# Patient Record
Sex: Male | Born: 1965
Health system: Western US, Academic
[De-identification: ages and names within clinical notes are randomized; demographics above are authoritative.]

---

## 2016-10-11 ENCOUNTER — Ambulatory Visit: Payer: BLUE CROSS/BLUE SHIELD

## 2019-02-25 ENCOUNTER — Telehealth: Payer: BLUE CROSS/BLUE SHIELD

## 2019-02-25 NOTE — Telephone Encounter
Call Back Request    MD:  [facility] Tra[facility]/Ortho Sports Surg    Reason for call back: Pt called in and would like to be seen by ortho for lt lower leg dog bite.Pt advised he got bit on 10/07 and went to closest ED that was [facility] had stiches antibiotics and a tetanus shot.Pt then f/u with PCP 2 days later and then had the wound infected per his pcp and was given more antibiotics.Pt went back to the ED on 10/11 and was admitted do to the infection and was then discharged on 10/14.Pt at this time has been following up with Infectious Disease doctor.Pt would like to be seen by ortho since swelling of leg is still present and hasn't subsided.Please advise and call back pt who would be appropriate Ortho to see pt for this matter.Thank you.    Any Symptoms:  []  Yes  [x]  No      ? If yes, what symptoms are you experiencing:    o Duration of symptoms (how long):    o Have you taken medication for symptoms (OTC or Rx):      Patient or caller has been notified of the 24-48 hour turnaround time.

## 2019-02-25 NOTE — Telephone Encounter
Reply by: Karie Mainland  Probably one of the non op or general ortho should see this patient.

## 2019-02-25 NOTE — Telephone Encounter
Called patient and left voicemail asking patient to return call to office.   Called patient to inquire and confirm if patient has a fracture in his lower extremity, if not, patient should not be scheduled with Dr. Alyse Low.

## 2019-02-26 NOTE — Telephone Encounter
After speaking with Dr. Dewayne Shorter, he has okay patient be seen for swelling of leg for evaluation.

## 2019-03-02 NOTE — Progress Notes
Date: 03/03/2019   Name: Evan Fitzgerald   MRN: [id number]   DOB: [birth year]   Age: 53 y.o.         Evan Fitzgerald, M.D.        Patient was seen in our [employer]    A COVID-19 questionnaire was filled out by the patient and was answered as NEGATIVE for all statements, including, COVID-19 positive diagnosis in the last 14 days, contact with anybody diagnosed with  COVID-19 in the last 14 days, fever, headaches, muscle pain, weakness, and diarrhea nausea vomiting abdominal pain, respiratory illness/cough, shortness of breath, loss of smell, loss of taste, rash skin irritation, unexplained hemorrhage, and fatigue. Temperature was taken and it was less than 100? F.     Referring Provider:   No ref. provider found    Chief Complaint:   Left Ankle Pain    History: Evan Fitzgerald is a 53 y.o. male who presents today for orthopedic evaluation of his Left ankle. Pain began on ***.    Evan Fitzgerald indicates that his pain is {Blank single:19197::''sharp'',''aching'',''throbbing'',''dull''}, and rates the severity as ***/10. The problem is localized over the *** aspect. The patient {Blank single:20442::''admits'',''denies''} ankle instability. The pain is aggravated by activity and {Blank single:19197::''does not improve'',''improves''} with rest.  The patient has tried OTC pain medications without relief.     PRIOR TREATMENT HAS INCLUDED:  []  Physical Therapy   []  Cortisone Injection  []  Surgery on this body part  []  NSAID  []  Icing        Hobbies/Sports: ***    Occupation: ***    PAST MEDICAL HISTORY:   Past Medical History:   Diagnosis Date   ? Herpes    ? MRSA cellulitis     Lt foot         PAST SURGICAL HISTORY:   No past surgical history on file.    MEDICATIONS:  Outpatient Medications Prior to Visit   Medication Sig   ? clarithromycin 500 mg tablet take 1 tablet by mouth twice a day for 14 days   ? dicyclomine 20 mg tablet TAKE 1 TABLET BEFORE MEALS AND AT BEDTIME AS NEEDED ? doxycycline 100 mg capsule Take 1 capsule (100 mg total) by mouth two (2) times daily.   ? finasteride (PROPECIA) 1 mg tablet Take 1 mg by mouth daily.   ? Sildenafil Citrate (VIAGRA PO) Take by mouth.   ? sulfacetamide-sulfur 10-5% emulsion Apply topically two (2) times daily WASH AFFECTED AREA(S) TWICE DAILY AS DIRECTED.   ? tacrolimus 0.1% ointment Apply topically two (2) times daily.   ? triamcinolone 0.1% ointment Apply topically two (2) times daily Apply to affected areas 2 times daily x 10 days.   ? valacyclovir 500 mg tablet    ? ValACYclovir HCl (VALTREX PO) Take by mouth.   ? VIAGRA 100 MG tablet take 1 tablet by mouth once daily     No facility-administered medications prior to visit.        ALLERGIES:  No Known Allergies      PATIENT SOCIAL HISTORY:   Social History     Socioeconomic History   ? Marital status: Married     Spouse name: Not on file   ? Number of children: Not on file   ? Years of education: Not on file   ? Highest education level: Not on file   Occupational History   ? Not on file   Social Needs   ?  Financial resource strain: Not on file   ? Food insecurity:     Worry: Not on file     Inability: Not on file   ? Transportation needs:     Medical: Not on file     Non-medical: Not on file   Tobacco Use   ? Smoking status: Never Smoker   Substance and Sexual Activity   ? Alcohol use: Not on file   ? Drug use: Not on file   ? Sexual activity: Not on file   Lifestyle   ? Physical activity:     Days per week: Not on file     Minutes per session: Not on file   ? Stress: Not on file   Relationships   ? Social connections:     Talks on phone: Not on file     Gets together: Not on file     Attends religious service: Not on file     Active member of club or organization: Not on file     Attends meetings of clubs or organizations: Not on file     Relationship status: Not on file   Other Topics Concern   ? Not on file   Social History Narrative   ? Not on file Family Medical History: Non contributory by the patient.    Review of Systems: The review of systems as documented today in the medical record is remarkable for the positive orthopedic problems discussed above and is otherwise non-contributory with respect to Constitutional, ENT, Cardiovascular, GU, Skin, Neurologic, Endocrine,   Hematologic, Psychiatric, Gastrointestinal, Respiratory, Eyes and Allergic/Immunologic systems.      PHYSICAL EXAM:   There were no vitals filed for this visit. There were no vitals filed for this visit.    Constitutional: Evan Fitzgerald is a wdwn 53 y.o. male in no acute distress.   Psyc: He is alert and oriented x3 with a normal mood and affect.  ENT: Hearing is intact and is unassisted by a hearing aid.   Eye: EOM intact and vision is unassisted by corrective lenses.    Neuro: Skin sensation is intact distally to the medial, lateral, dorsal, plantar, and first dorsal web-space distribution  Vascular: Pulses are intact distally.  Skin: Intact, no edema.  Musculoskeletal:   Left lower extremity  Patient ambulates {Select:27552}  GAIT: Ambulates without limp  STANDING ALIGNMENT: Neutral  INSPECTION: His ankle reveals:  no effusion,  no edema,   no ecchymosis     PALPATION:   no tenderness over ATFL,   no tenderness over CFL,   no tenderness over PTFL,  no tenderness deltoid ligament,   no tenderness over peroneal tendons,   no tenderness over posterior tibialis,  no tenderness over the medial malleolus,   no tenderness over the lateral malleolus,   no tenderness over the syndesmosis,   no tenderness over the sinus tarsi,   no tenderness over Achilles insertion,   no tenderness over medial calcaneal tuberosity,   no tenderness over plantar fascia.    RANGE OF MOTION:   ***? of dorsiflexion,   ***? of plantarflexion,   ***? of inversion and ***?eversion    SPECIAL TESTS:   - syndesmotic squeeze test   - Homan?s Sign  - Thompson?s test  - Tinel Test The ankle is stable to anterior/internal rotation drawer  The ankle is stable to anterior/external rotation drawer    STRENGTH:  5/5 in Dorsiflexion  5/5 in Plantarflexion  5/5 in Eversion  5/5 in  Inversion    X-Rays: 3 views of the Left weightbearing ankle were ordered, taken and evaluated today. These reveal mortise to be intact and reduced with no evidence of fracture, dislocation or osteochondral lesion    Impression:   1. Left    Fitzgerald: Reviewed my findings with the patient. I recommended that the patient:     1.          All questions were answered with stated understanding.      NEXT APPOINTMENT:  The patient will follow up in    Portions of this chart for patient Elmond Poehlman on 03/03/2019 10:31 AM, were written by Dorian Pod, acting as the medical scribe, with oversight and review by Evan Plan, MD.        ___________________  Albesa Seen, MD  ORTHOPEDIC SURGERY  03/03/2019

## 2019-03-03 ENCOUNTER — Ambulatory Visit: Payer: PRIVATE HEALTH INSURANCE

## 2019-03-04 ENCOUNTER — Telehealth: Payer: BLUE CROSS/BLUE SHIELD

## 2019-03-04 NOTE — Telephone Encounter
-----   Message from Janeece Riggers sent at 03/02/2019 12:05 PM PDT -----  Hi [name],Patient left vm for you, returning your call. No specific details provided.     Can you please follow up with patient?    -Donna Christen

## 2019-03-04 NOTE — Telephone Encounter
Left a message for patient Evan Fitzgerald requesting a call out.  Per my understanding he called me back during my absence.  I'm returning his call at this time for the scheduling of his Consult.

## 2019-03-13 ENCOUNTER — Ambulatory Visit: Payer: BLUE CROSS/BLUE SHIELD

## 2019-03-13 NOTE — Progress Notes
Date: 03/13/2019   Name: Evan Fitzgerald   MRN: [id number]   DOB: 09-Jul-1965   Age: 53 y.o.         Maia Plan, M.D.        Patient was seen in our [employer]    A COVID-19 questionnaire was filled out by the patient and was answered as NEGATIVE for all statements, including, COVID-19 positive diagnosis in the last 14 days, contact with anybody diagnosed with  COVID-19 in the last 14 days, fever, headaches, muscle pain, weakness, and diarrhea nausea vomiting abdominal pain, respiratory illness/cough, shortness of breath, loss of smell, loss of taste, rash skin irritation, unexplained hemorrhage, and fatigue. Temperature was taken and it was less than 100? F.     Chief Complaint:   Left Ankle Pain    History: Evan Fitzgerald is a 53 y.o. male who presents today for orthopedic evaluation of his Left ankle. Pain began on ***.    Evan Fitzgerald indicates that his pain is {Blank single:19197::''sharp'',''aching'',''throbbing'',''dull''}, and rates the severity as ***/10. The problem is localized over the *** aspect. The patient {Blank single:20442::''admits'',''denies''} ankle instability. The pain is aggravated by activity and {Blank single:19197::''does not improve'',''improves''} with rest.  The patient has tried OTC pain medications without relief.     PRIOR TREATMENT HAS INCLUDED:  []  Physical Therapy   []  Cortisone Injection  []  Surgery on this body part  []  NSAID  []  Icing        Hobbies/Sports: ***    Occupation: ***    PAST MEDICAL HISTORY:   Past Medical History:   Diagnosis Date   ? Herpes    ? MRSA cellulitis     Lt foot         PAST SURGICAL HISTORY:   No past surgical history on file.    MEDICATIONS:  Outpatient Medications Prior to Visit   Medication Sig   ? clarithromycin 500 mg tablet take 1 tablet by mouth twice a day for 14 days   ? dicyclomine 20 mg tablet TAKE 1 TABLET BEFORE MEALS AND AT BEDTIME AS NEEDED   ? doxycycline 100 mg capsule Take 1 capsule (100 mg total) by mouth two (2) times daily. ? finasteride (PROPECIA) 1 mg tablet Take 1 mg by mouth daily.   ? Sildenafil Citrate (VIAGRA PO) Take by mouth.   ? sulfacetamide-sulfur 10-5% emulsion Apply topically two (2) times daily WASH AFFECTED AREA(S) TWICE DAILY AS DIRECTED.   ? tacrolimus 0.1% ointment Apply topically two (2) times daily.   ? triamcinolone 0.1% ointment Apply topically two (2) times daily Apply to affected areas 2 times daily x 10 days.   ? valacyclovir 500 mg tablet    ? ValACYclovir HCl (VALTREX PO) Take by mouth.   ? VIAGRA 100 MG tablet take 1 tablet by mouth once daily     No facility-administered medications prior to visit.        ALLERGIES:  No Known Allergies      PATIENT SOCIAL HISTORY:   Social History     Socioeconomic History   ? Marital status: Married     Spouse name: Not on file   ? Number of children: Not on file   ? Years of education: Not on file   ? Highest education level: Not on file   Occupational History   ? Not on file   Social Needs   ? Financial resource strain: Not on file   ? Food insecurity:  Worry: Not on file     Inability: Not on file   ? Transportation needs:     Medical: Not on file     Non-medical: Not on file   Tobacco Use   ? Smoking status: Never Smoker   Substance and Sexual Activity   ? Alcohol use: Not on file   ? Drug use: Not on file   ? Sexual activity: Not on file   Lifestyle   ? Physical activity:     Days per week: Not on file     Minutes per session: Not on file   ? Stress: Not on file   Relationships   ? Social connections:     Talks on phone: Not on file     Gets together: Not on file     Attends religious service: Not on file     Active member of club or organization: Not on file     Attends meetings of clubs or organizations: Not on file     Relationship status: Not on file   Other Topics Concern   ? Not on file   Social History Narrative   ? Not on file         Family Medical History: Non contributory by the patient. Review of Systems: The review of systems as documented today in the medical record is remarkable for the positive orthopedic problems discussed above and is otherwise non-contributory with respect to Constitutional, ENT, Cardiovascular, GU, Skin, Neurologic, Endocrine, Hematologic, Psychiatric, Gastrointestinal, Respiratory, Eyes and Allergic/Immunologic systems.      PHYSICAL EXAM:   There were no vitals filed for this visit. There were no vitals filed for this visit.    Constitutional: Evan Fitzgerald is a wdwn 53 y.o. male in no acute distress.   Psyc: He is alert and oriented x3 with a normal mood and affect.  ENT: Hearing is intact and is unassisted by a hearing aid.   Eye: EOM intact and vision is unassisted by corrective lenses.    Neuro: Skin sensation is intact distally to the medial, lateral, dorsal, plantar, and first dorsal web-space distribution  Vascular: Pulses are intact distally.  Skin: Intact, no edema.  Musculoskeletal:   Left lower extremity  Patient ambulates {Select:27552}  GAIT: Ambulates without limp  STANDING ALIGNMENT: Neutral  INSPECTION: His ankle reveals:  no effusion,  no edema,   no ecchymosis     PALPATION:   no tenderness over ATFL,   no tenderness over CFL,   no tenderness over PTFL,  no tenderness deltoid ligament,   no tenderness over peroneal tendons,   no tenderness over posterior tibialis,  no tenderness over the medial malleolus,   no tenderness over the lateral malleolus,   no tenderness over the syndesmosis,   no tenderness over the sinus tarsi,   no tenderness over Achilles insertion,   no tenderness over medial calcaneal tuberosity,   no tenderness over plantar fascia.    RANGE OF MOTION:   ***? of dorsiflexion,   ***? of plantarflexion,   ***? of inversion and ***?eversion    SPECIAL TESTS:   - syndesmotic squeeze test   - Homan?s Sign  - Thompson?s test  - Tinel Test  The ankle is stable to anterior/internal rotation drawer The ankle is stable to anterior/external rotation drawer    STRENGTH:  5/5 in Dorsiflexion  5/5 in Plantarflexion  5/5 in Eversion  5/5 in Inversion    X-Rays: 3 views of the Left weightbearing ankle  were ordered, taken and evaluated today. These reveal mortise to be intact and reduced with no evidence of fracture, dislocation or osteochondral lesion    Impression:   1. Left    Plan: Reviewed my findings with the patient. I recommended that the patient:     1.          All questions were answered with stated understanding.      NEXT APPOINTMENT:  The patient will follow up in    Portions of this chart for patient Evan Fitzgerald on 03/13/2019 4:43 PM, were written by Dorian Pod, acting as the medical scribe, with oversight and review by Maia Plan, MD.        ___________________  Albesa Seen, MD  ORTHOPEDIC SURGERY  03/13/2019

## 2019-04-06 ENCOUNTER — Ambulatory Visit: Payer: PRIVATE HEALTH INSURANCE

## 2021-06-16 IMAGING — MR MRI RIGHT ANKLE WITHOUT CONTRAST
5 of 6 series · 24 of 40 positions shown · IV contrast (gadolinium)
Comparison: None.

________________________________________________________________________________________________ 
MRI RIGHT ANKLE WITHOUT CONTRAST, 06/16/2021 [DATE]: 
CLINICAL INDICATION: Sharp shooting "on fire" dull pain. Achilles tendinitis and 
plantar fasciitis, bone spur.
TECHNIQUE: Multiplanar, multiecho position MR images of the ankle were performed 
without intravenous gadolinium enhancement. Patient was scanned on a 3T magnet.

[Series 101: survey_fullfov_transversal · axial · 10.0mm · 1.84mm/px · z∈[-82,+284]mm · 7 of 36 slices shown]
[im 1/36]
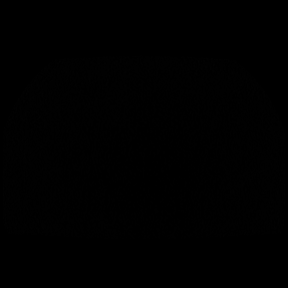
[im 6/36]
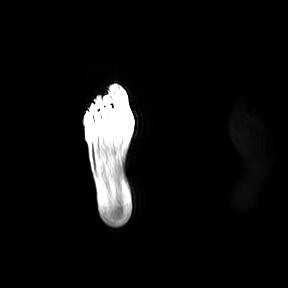
[im 12/36]
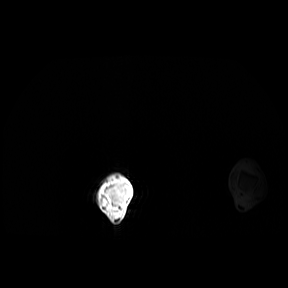
[im 18/36]
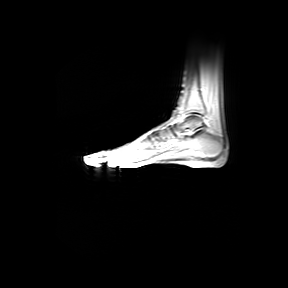
[im 24/36]
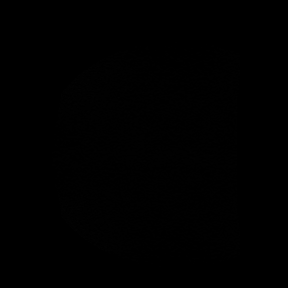
[im 30/36]
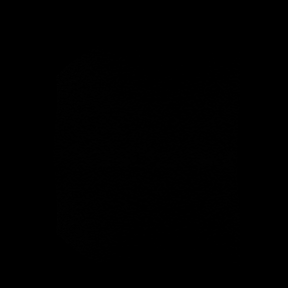
[im 36/36]
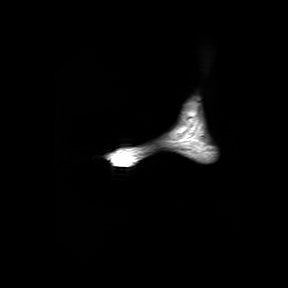

[Series 201: survey-mst · axial · 10.0mm · 0.94mm/px · z∈[-33,+178]mm · 4 of 17 slices shown]
[im 1/17]
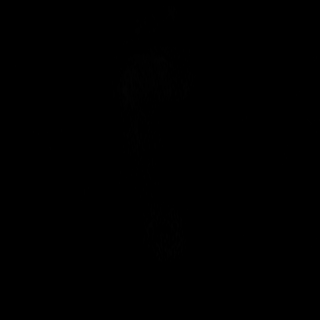
[im 6/17]
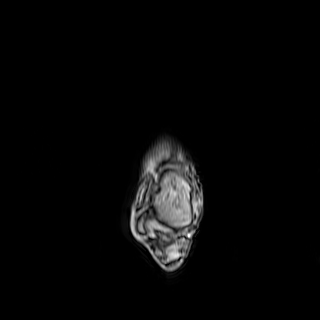
[im 11/17]
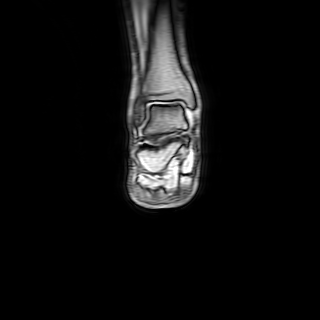
[im 17/17]
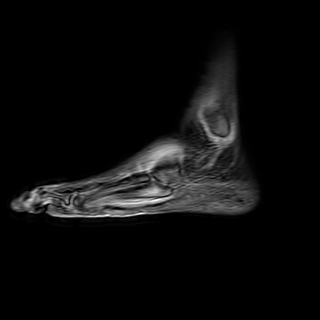

[Series 301: pdw_spair sag · sagittal · 2.7mm · 0.33mm/px · 6 of 28 slices shown]
[im 1/28]
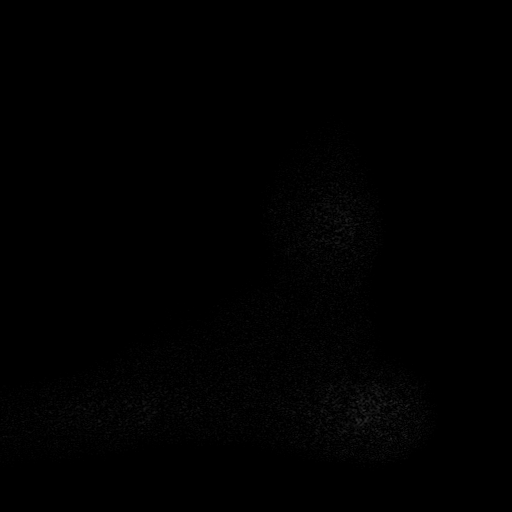
[im 6/28]
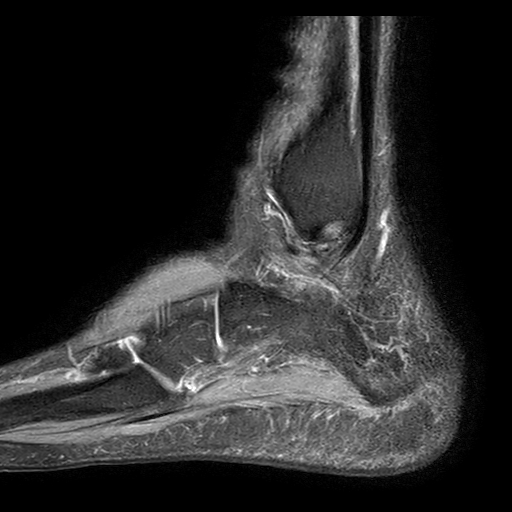
[im 11/28]
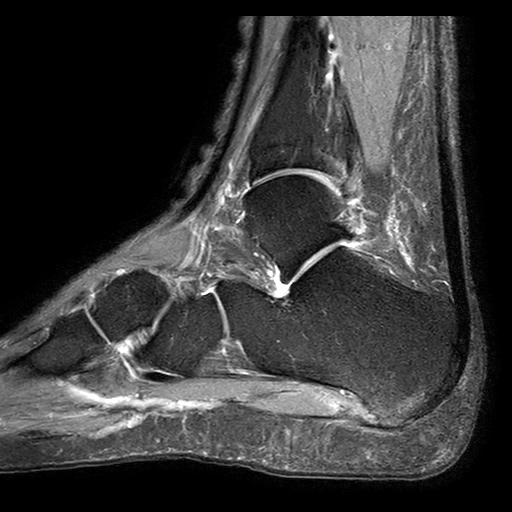
[im 17/28]
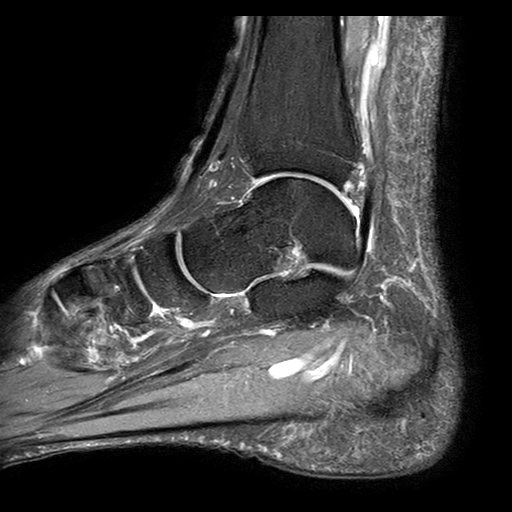
[im 22/28]
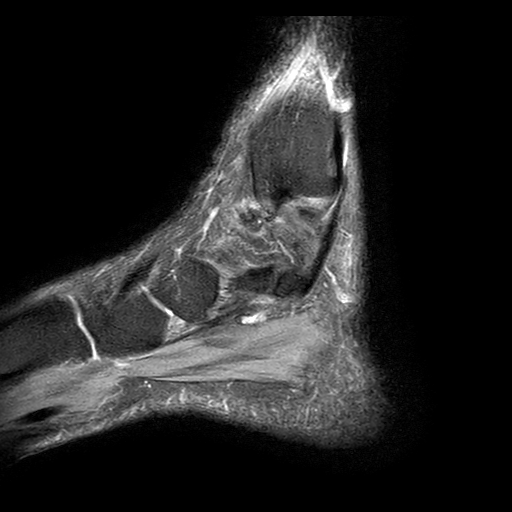
[im 28/28]
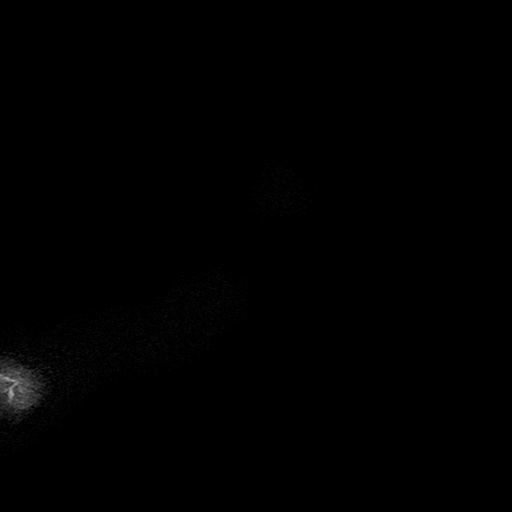

[Series 401: T1 · sagittal · 2.7mm · 0.30mm/px · 6 of 28 slices shown]
[im 1/28]
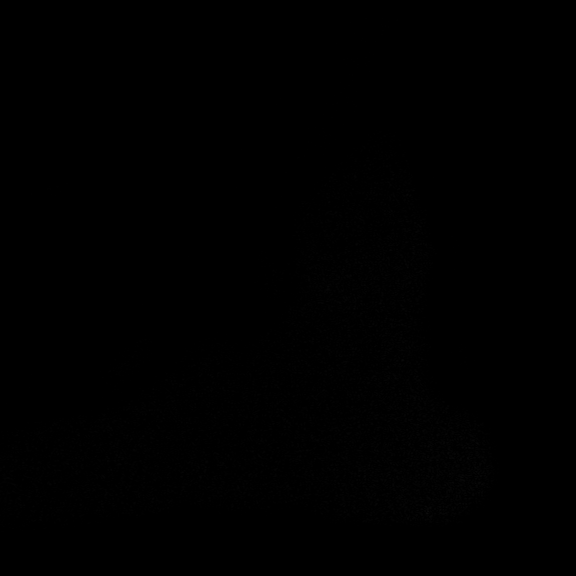
[im 6/28]
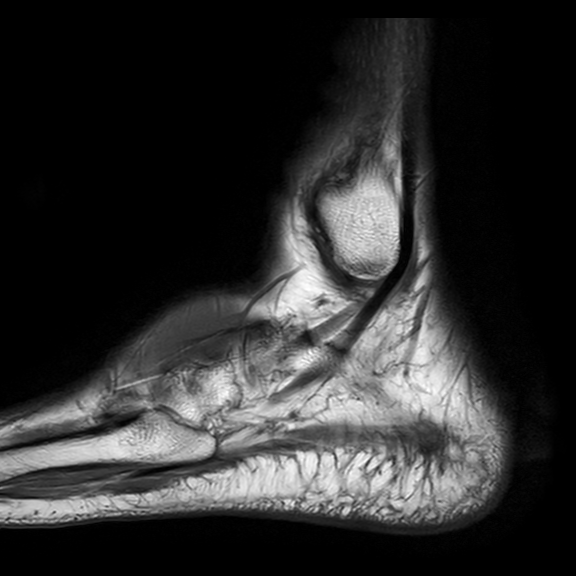
[im 11/28]
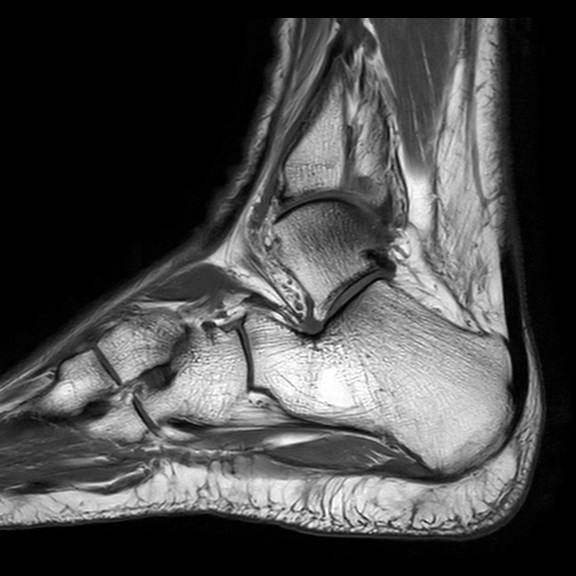
[im 17/28]
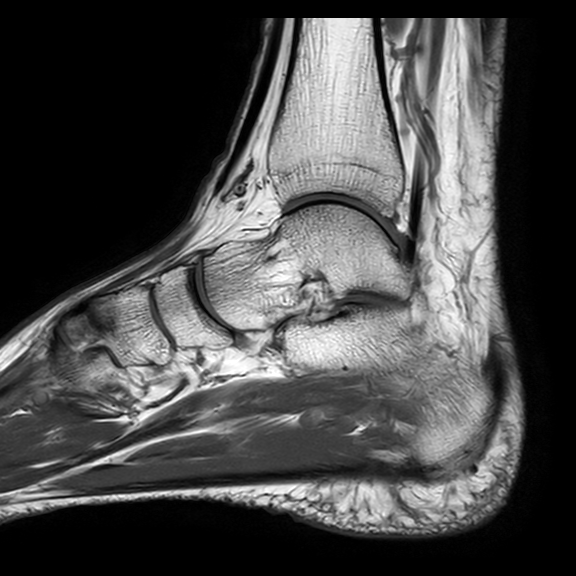
[im 22/28]
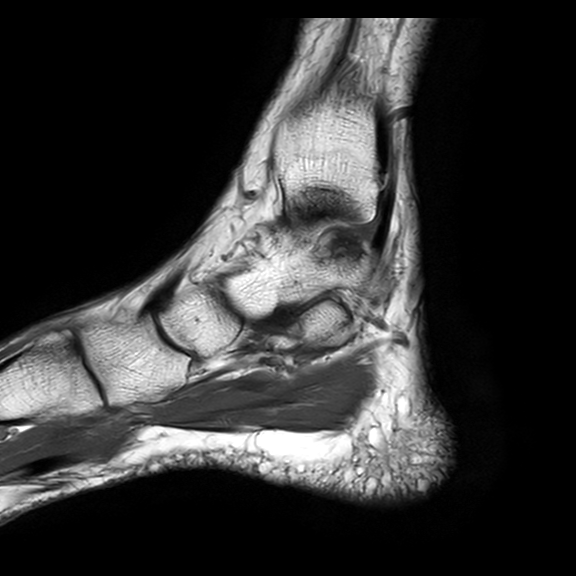
[im 28/28]
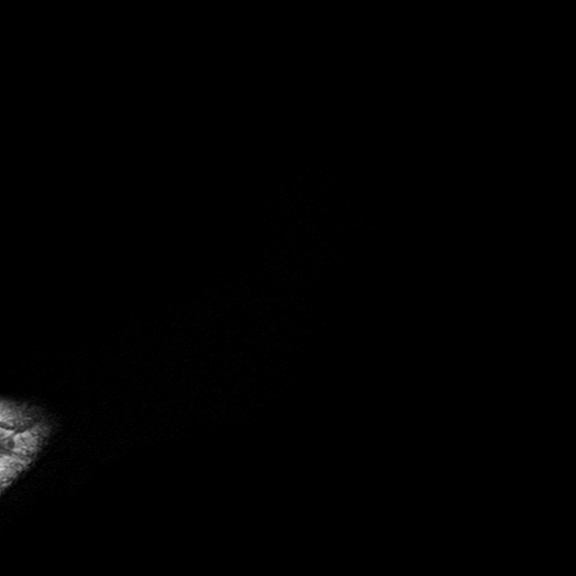

[Series 501: pdw_spair ax · axial · 3.0mm · 0.31mm/px · 1 of 45 slices shown]
[im 1/45]
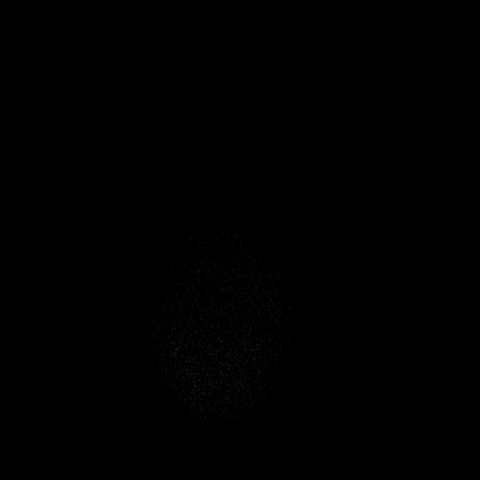

[24 of 40 positions shown; findings below may reference images not displayed]

FINDINGS: TENDONS: There is trace fluid within the posterior tibialis tendon sheath and 
mild thickening with intrasubstance signal abnormality in its insertion with 
mild tendinosis. The distal peroneal brevis is diminutive but appears intact to 
its insertion. Achilles tendon is preserved. Extensor tendons are intact. 
LIGAMENTS:  
LATERAL LIGAMENTS: There is chronic partial tearing of the anterior talofibular 
ligament with a diminutive appearance. The calcaneofibular ligament and 
posterior talofibular ligament are preserved. 
SYNDESMOTIC LIGAMENTS: The anterior and posterior tibiofibular and interosseous 
ligaments are preserved. 
DELTOID LIGAMENTOUS COMPLEX: The deep and superficial components of the deltoid 
ligamentous complex are intact. 
SINUS TARSI LIGAMENTS: The cervical and interosseous ligaments are preserved. 
The inferior extensor retinaculum appears intact.  
FALLON LIGAMENTOUS COMPLEX: Plantar calcaneonavicular ligament preserved. 
BONES AND JOINTS: There is mild marrow edema within the anterior calcaneus at 
the plantar fascial attachment. No fracture. Talar dome is intact. No focal 
osteochondral lesion. Articular cartilage is preserved. 
ADDITIONAL FINDINGS: There is partial tearing of both the central and medial 
bands of the plantar fascia with an edematous appearance at the calcaneal 
attachment. No complete disruption. Tarsal tunnel is preserved. Sinus tarsi fat 
is preserved.
IMPRESSION: 1.  Acute plantar fasciitis with partial tearing at the calcaneal attachment 
with surrounding reactive marrow and soft tissue edema. 
2.  Trace posterior tibialis tenosynovitis and mild distal tendinosis. 
3.  Chronic partial tearing the anterior talofibular ligament. 
4.  Partial tearing with a diminutive appearance of the distal peroneal brevis 
tendon without discrete full-thickness disruption. 
5.  No Achilles tendinosis.

## 2021-07-19 IMAGING — MR MRI RIGHT FOOT WITHOUT CONTRAST
4 of 6 series · 15 of 40 positions shown · IV contrast (gadolinium)
Comparison: None.

________________________________________________________________________________________________ 
******** ADDENDUM #1 ********/n 
Comparison is made with MR of the ankle of 06/16/2021. 
Interval progression of tearing of the plantar fascial at the calcaneal 
insertion with interval increase in surrounding soft tissue edema and calcaneal 
marrow edema. Interval development of full-thickness tear of the majority of the 
proximal central band of the plantar fascial with partial-thickness tearing of 
the remaining proximal central band. 
MRI RIGHT FOOT WITHOUT CONTRAST, 07/19/2021 [DATE]: 
CLINICAL INDICATION: Plantar fascial tear. Pain.
TECHNIQUE: Multiplanar, multiecho position MR images of the foot were performed 
without intravenous gadolinium enhancement. Patient was scanned on a 3T magnet.

[Series 201: survey_mst · axial · 10.0mm · 0.94mm/px · z∈[-39,+169]mm · 2 of 15 slices shown]
[im 1/15]
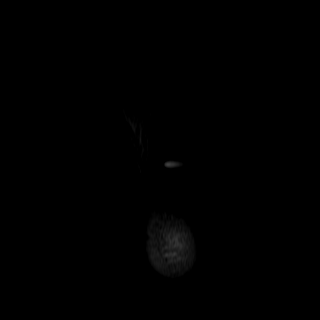
[im 15/15]
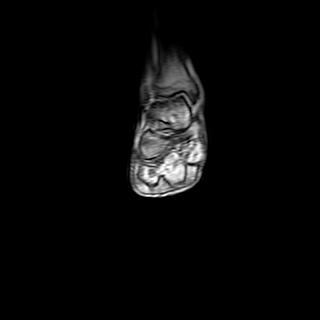

[Series 301: t1w_foot · sagittal · 2.5mm · 0.31mm/px · 3 of 34 slices shown]
[im 7/34]
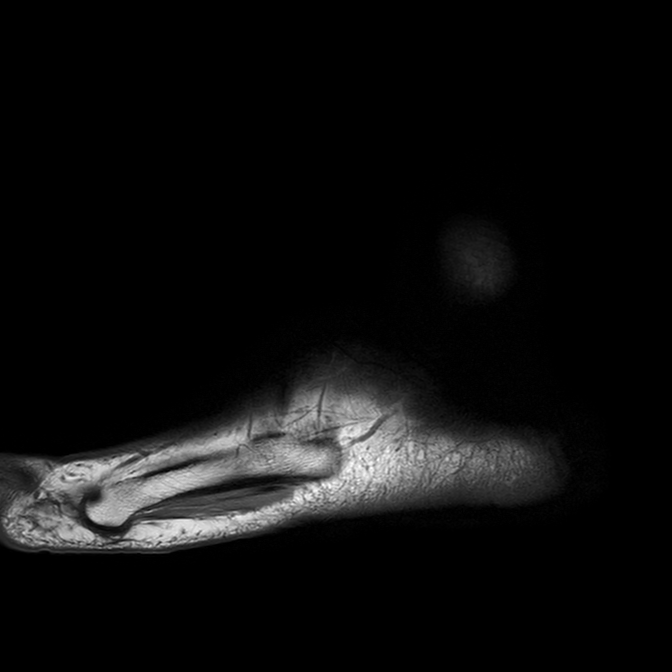
[im 20/34]
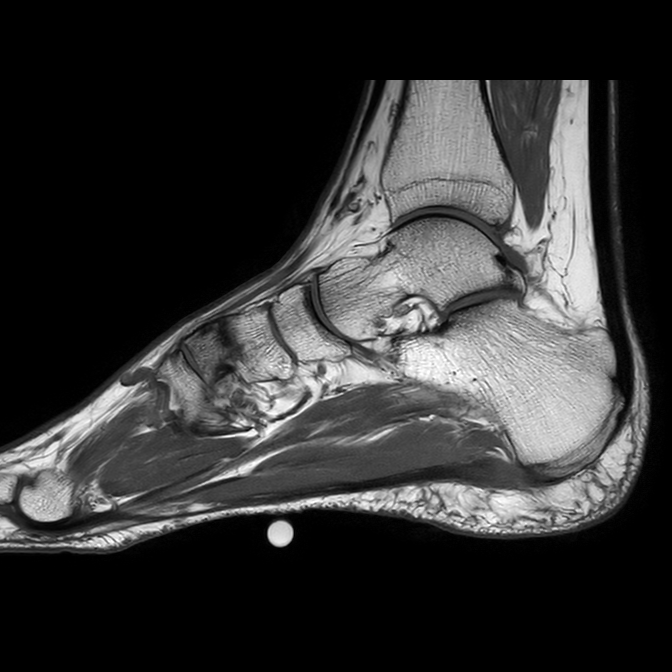
[im 34/34]
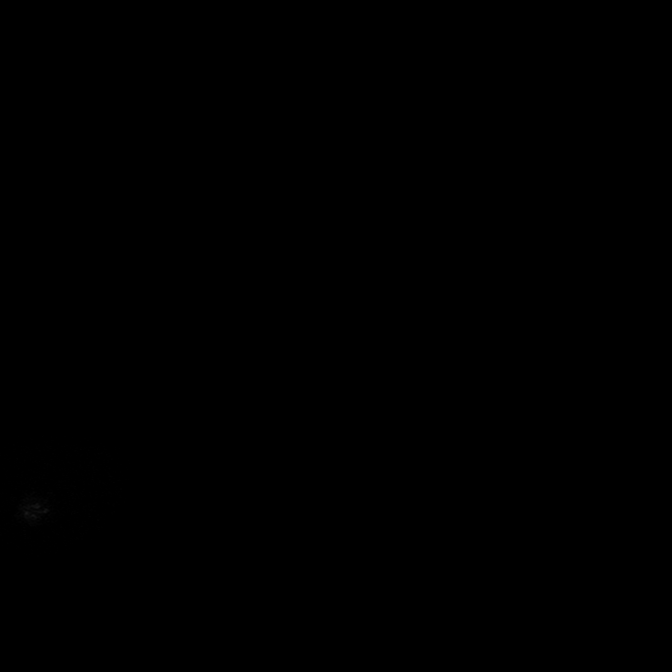

[Series 401: PD fat-sat · sagittal · 2.5mm · 0.33mm/px · 6 of 34 slices shown]
[im 1/34]
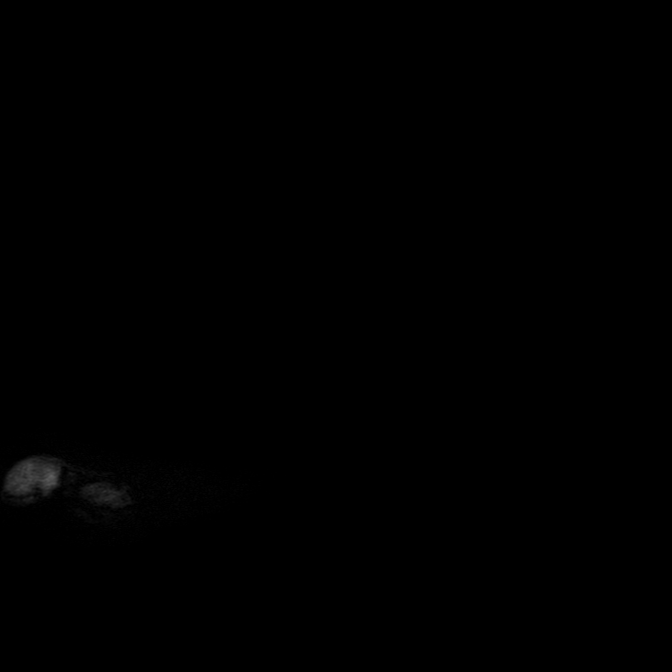
[im 7/34]
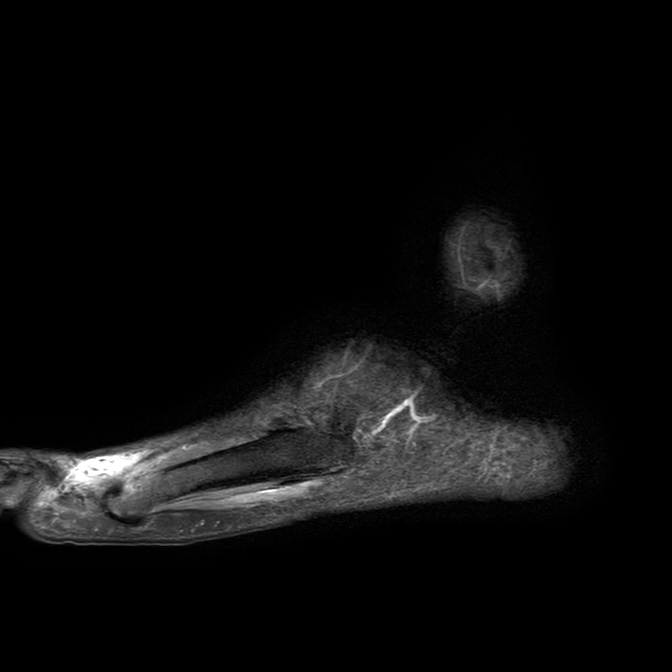
[im 14/34]
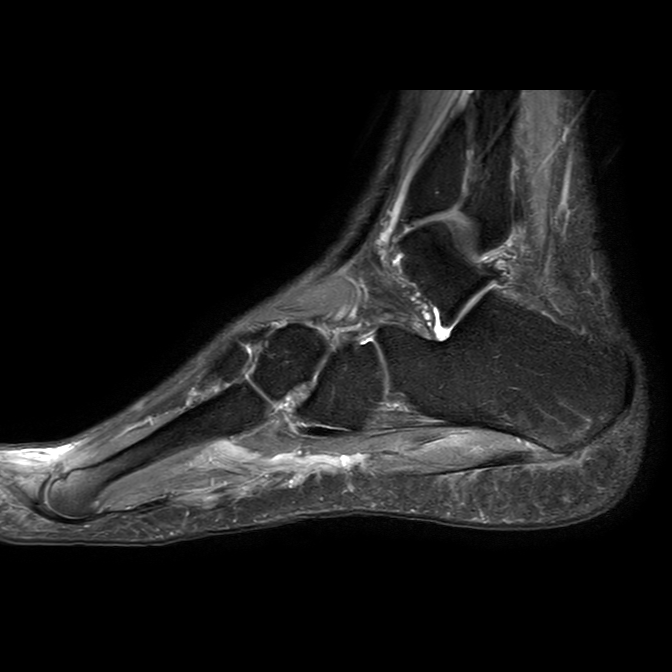
[im 20/34]
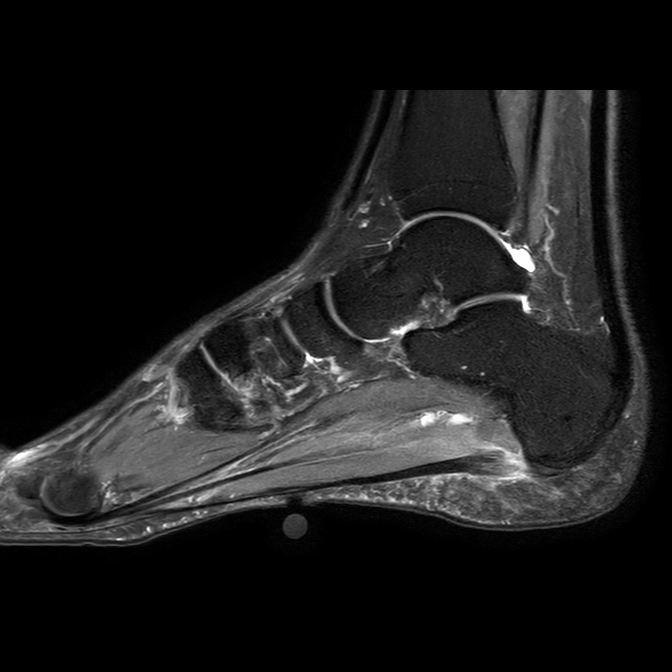
[im 27/34]
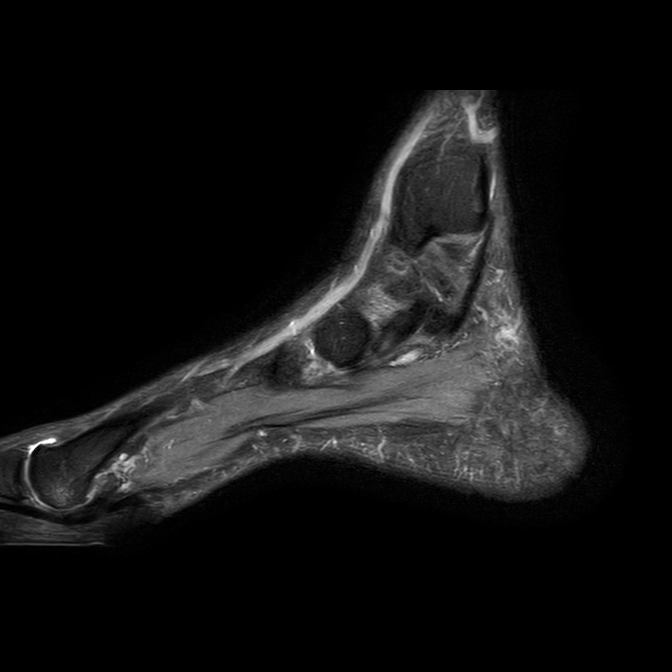
[im 34/34]
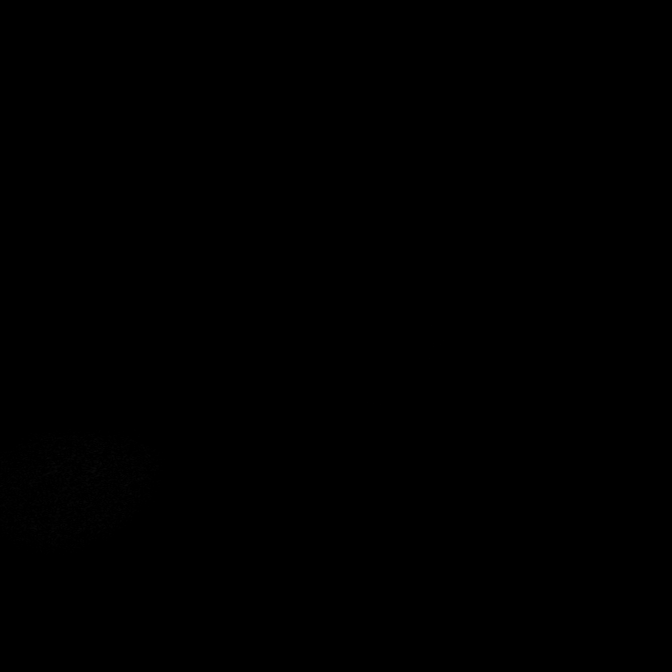

[Series 601: T1 · coronal · 3.2mm · 0.27mm/px · 4 of 61 slices shown]
[im 1/61]
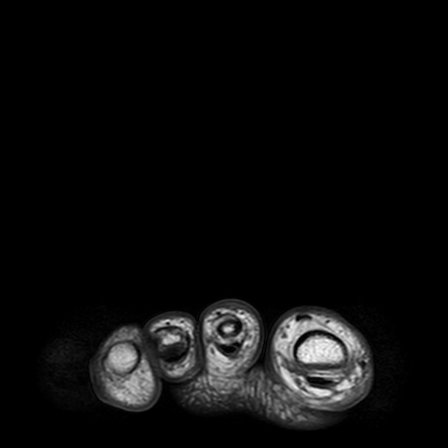
[im 7/61]
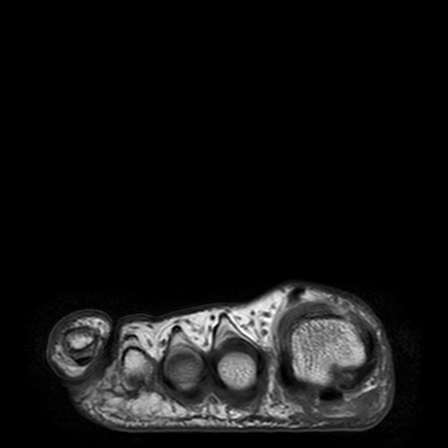
[im 34/61]
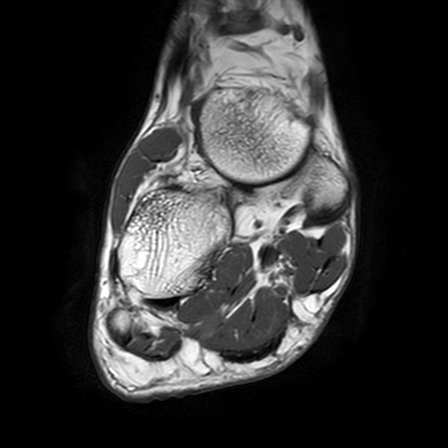
[im 54/61]
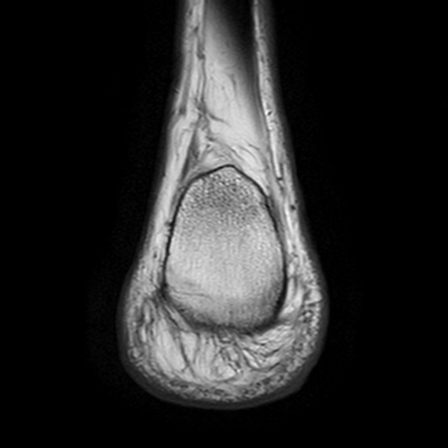

[15 of 40 positions shown; findings below may reference images not displayed]

FINDINGS: PLANTAR FASCIA: Full-thickness tear of the proximal central band of the plantar 
fascia measures less than 1 cm in medial to lateral dimensions with up to 0.6 cm 
retraction of the fascial remnant. High-grade partial-thickness tearing of the 
remaining proximal central band. Thickening (0.9 cm) of the adjacent plantar 
fascia and intermediate signal intensity, consistent with plantar fasciitis. 
Small amount of adjacent hemorrhage and perifascial edema-like signal changes. 
TENDONS: The flexor, extensor and peroneal tendons are intact. No tendon tear, 
tenosynovitis or tendinopathy. No tendon subluxation. The Achilles tendon is 
preserved. 
LIGAMENTS:  
LATERAL LIGAMENTS: The anterior talofibular ligament is intact. The 
calcaneofibular ligament and posterior talofibular ligament are preserved. 
SYNDESMOTIC LIGAMENTS: The anterior and posterior tibiofibular and interosseous 
ligaments are preserved. 
DELTOID LIGAMENTOUS COMPLEX: The deep and superficial components of the deltoid 
ligamentous complex are intact. 
SINUS TARSI LIGAMENTS: The cervical and interosseous ligaments are preserved. 
The inferior extensor retinaculum appears intact.  
BONES AND JOINTS: No fracture. Minimal edema-like signal changes in the plantar 
posterior calcaneus. 0.2 x 0.3 cm central talar dome subchondral marrow edema. 
No focal cartilaginous defects. Ankle and subtalar joints are preserved. Midfoot 
joints are preserved. Mild degenerative change of the hallux 
metatarsal-phalangeal (MTP) joint. Bipartite fibular hallux sesamoid and 
degenerative change of the sesamoids. 
MUSCLES: Musculature is symmetric without mass, signal abnormality or atrophy.  
OTHER SOFT TISSUES: Plantar skin marker overlying the mid central band of the 
plantar fascia indicates the site of symptoms. No underlying lesion. Tarsal 
tunnel is preserved. Sinus tarsi fat is preserved. No ulceration. No abscess.
IMPRESSION: 1.  Small full-thickness tear of the proximal central band of the plantar 
fascia, high-grade partial-thickness tearing of the remaining proximal central 
band, plantar fasciitis and calcaneal marrow edema.  
2.  0.2 x 0.3 cm central talar dome subchondral marrow edema.  
3.  Mild degenerative change of the hallux MTP joint, bipartite fibular hallux 
sesamoid and degenerative change of the sesamoids.

## 2022-02-22 IMAGING — MR MRI LEFT SHOULDER WITHOUT CONTRAST
5 of 7 series · 23 of 40 positions shown · IV contrast (gadolinium)
Comparison: 02/22/2022 [facility] radiographs

________________________________________________________________________________________________ 
MRI LEFT SHOULDER WITHOUT CONTRAST, 02/22/2022 [DATE]: 
CLINICAL INDICATION: Complete rotator cuff tear, rupture of left shoulder, not 
traumatic.
TECHNIQUE: Multiplanar, multiecho position MR images of the shoulder were 
performed without intravenous gadolinium enhancement. Patient was scanned on a 
3T magnet.

[Series 201: survey_left · axial · 10.0mm · 0.99mm/px · z∈[-40,+175]mm · 2 of 15 slices shown]
[im 1/15]
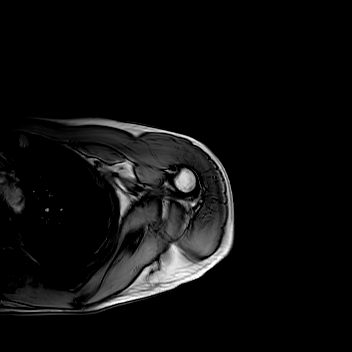
[im 15/15]
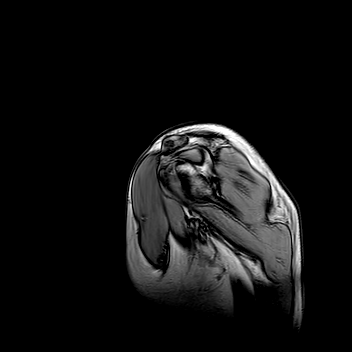

[Series 301: pdw spair left · axial · 2.5mm · 0.27mm/px · z∈[-55,+40]mm · 8 of 36 slices shown]
[im 1/36]
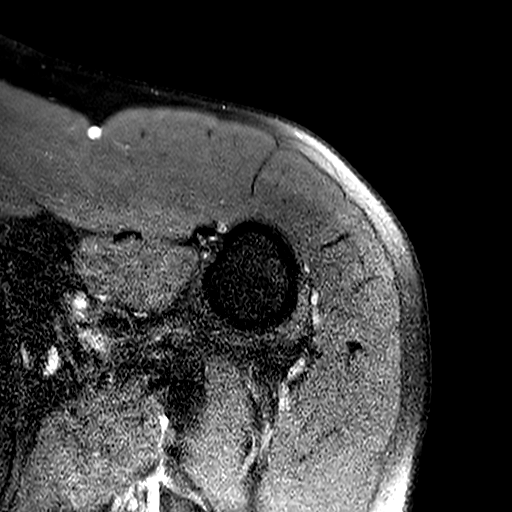
[im 6/36]
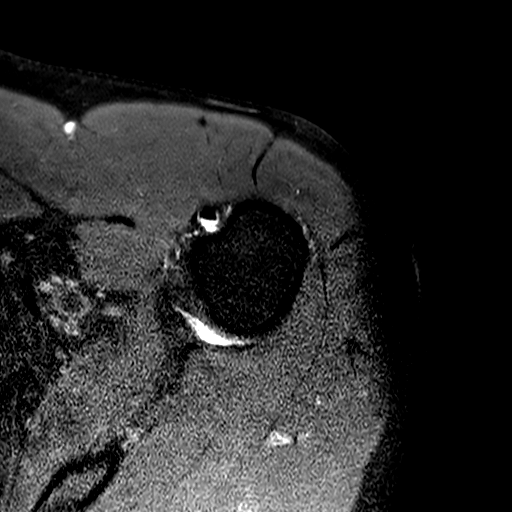
[im 11/36]
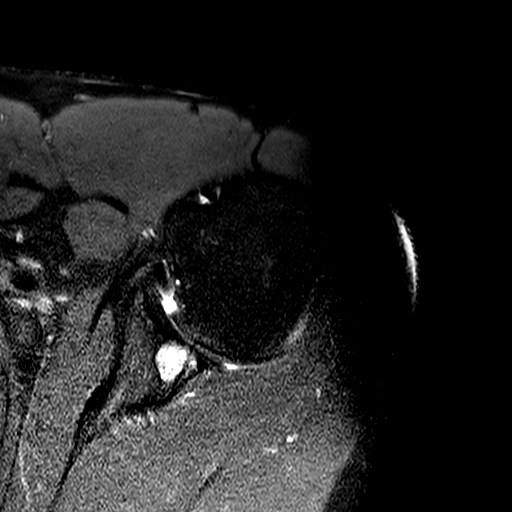
[im 16/36]
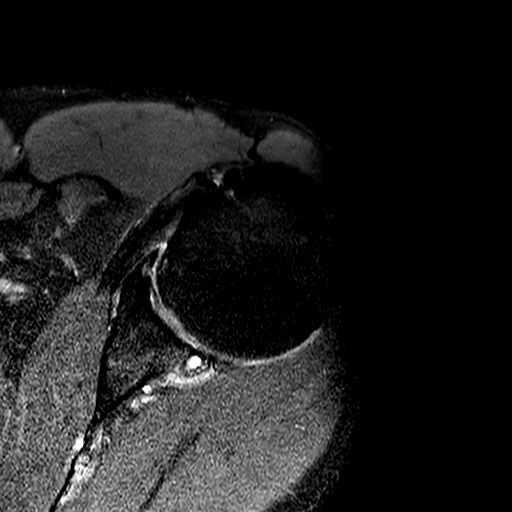
[im 21/36]
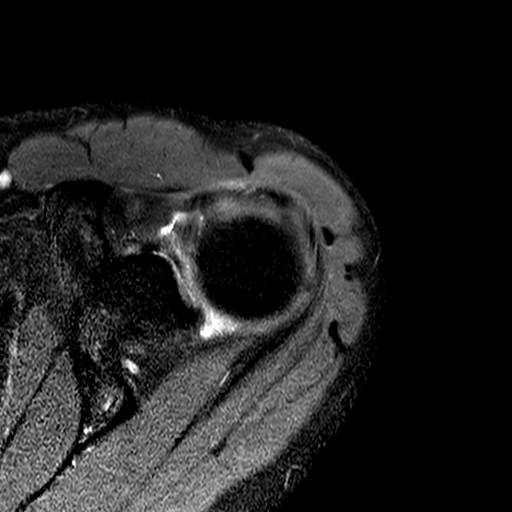
[im 26/36]
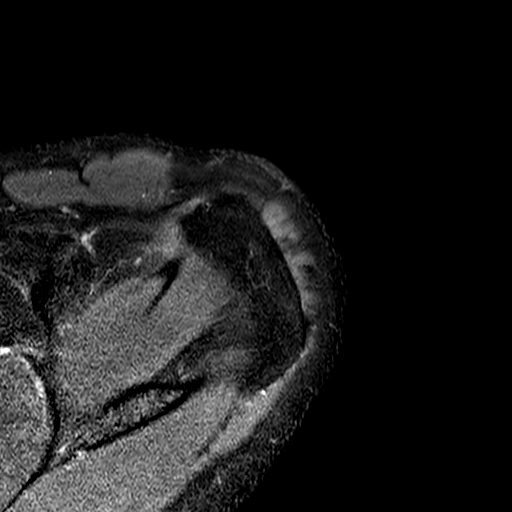
[im 31/36]
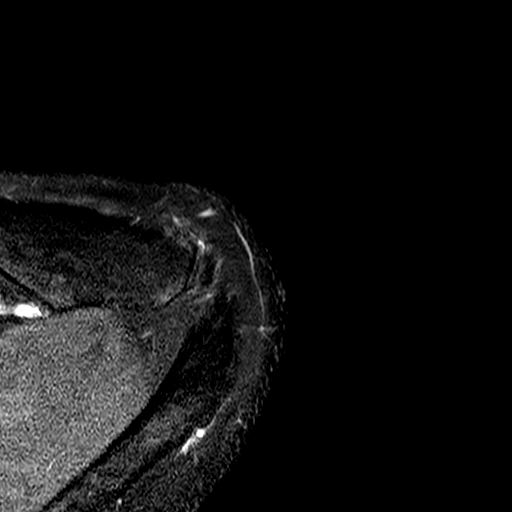
[im 36/36]
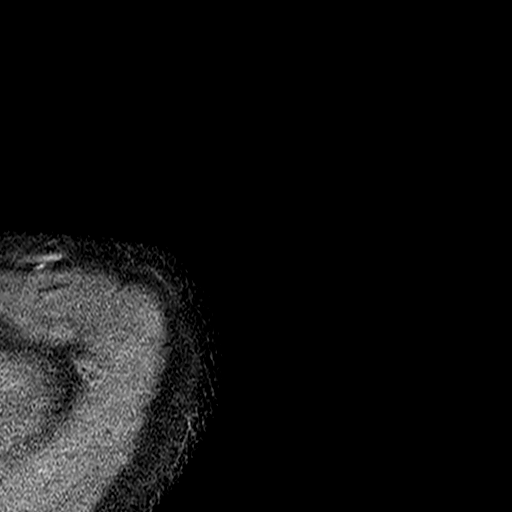

[Series 401: t2w spair sag · oblique · 3.2mm · 0.38mm/px · 6 of 26 slices shown]
[im 1/26]
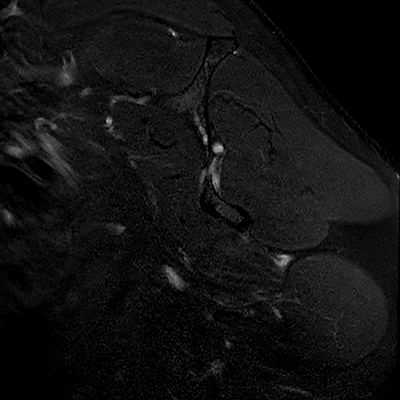
[im 6/26]
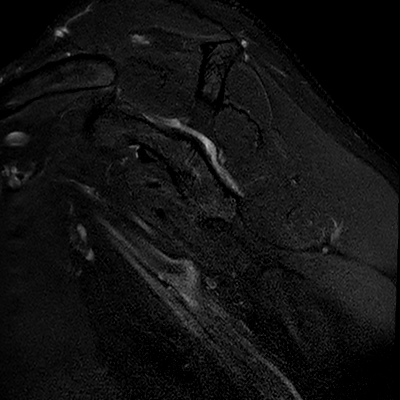
[im 11/26]
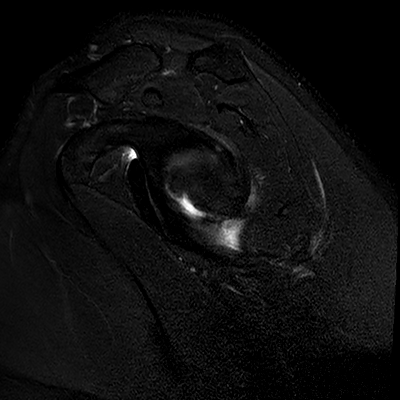
[im 16/26]
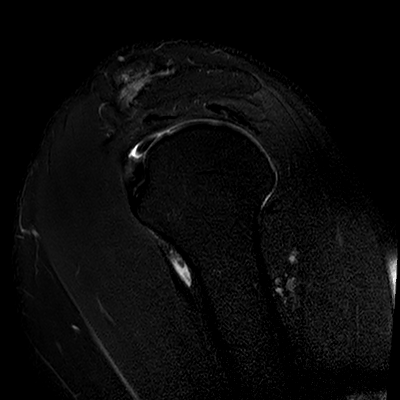
[im 21/26]
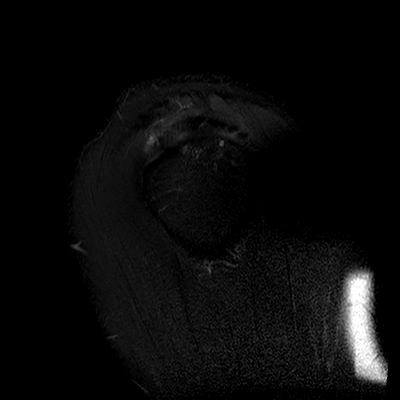
[im 26/26]
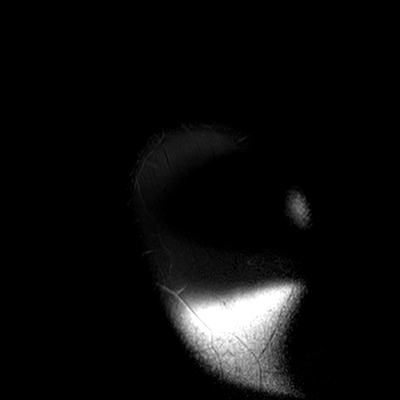

[Series 501: t2w spair cor · oblique · 2.5mm · 0.38mm/px · 5 of 24 slices shown]
[im 1/24]
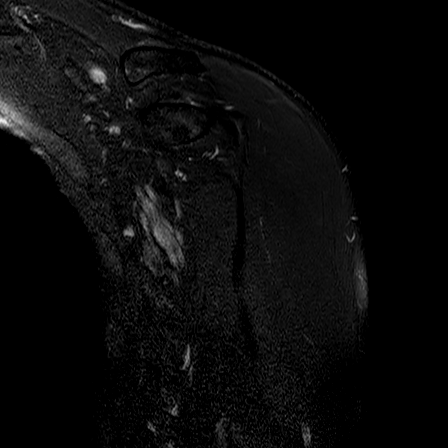
[im 6/24]
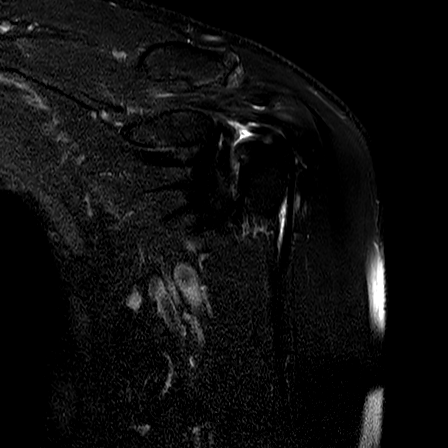
[im 12/24]
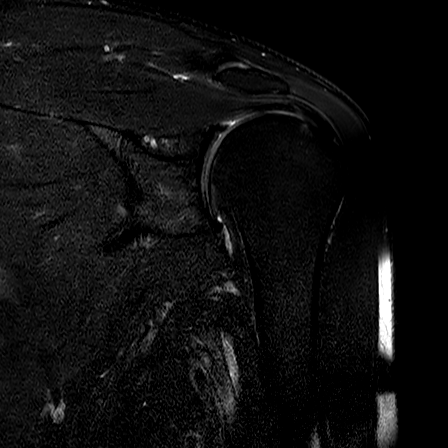
[im 18/24]
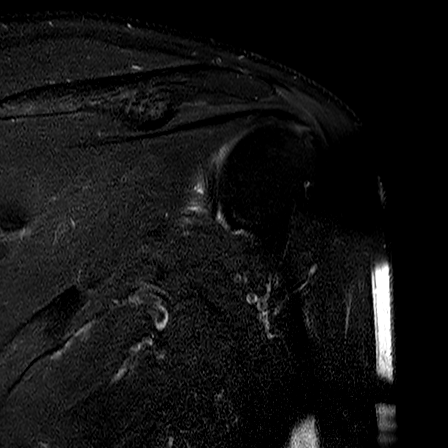
[im 24/24]
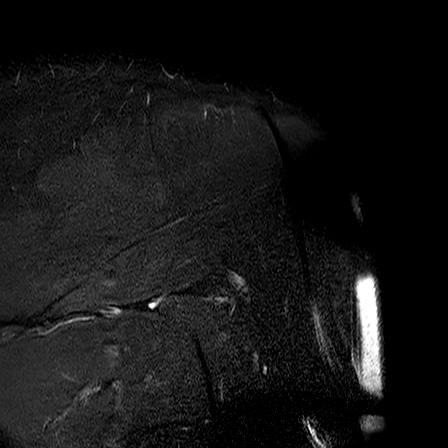

[Series 601: t1w tse sag · oblique · 3.2mm · 0.28mm/px · 2 of 26 slices shown]
[im 1/26]
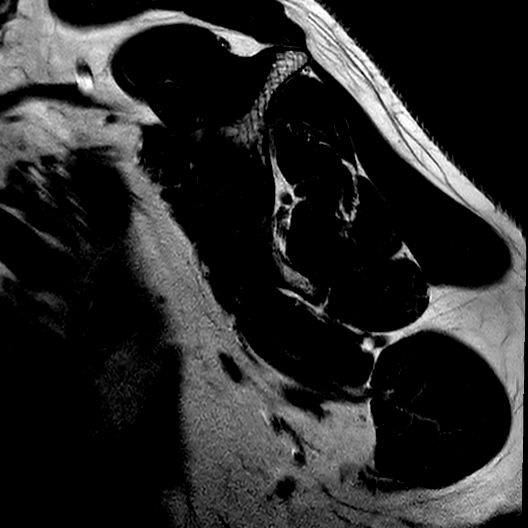
[im 6/26]
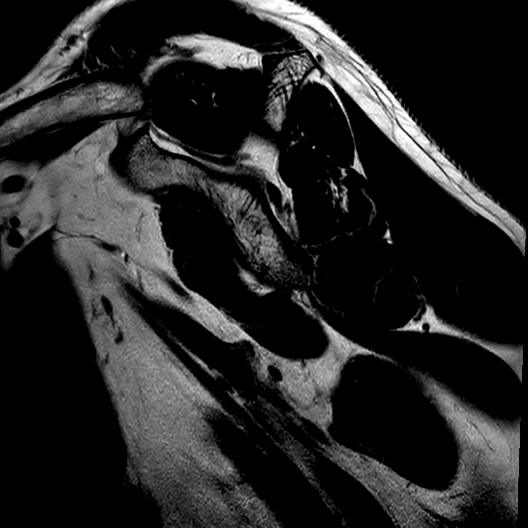

[23 of 40 positions shown; findings below may reference images not displayed]

FINDINGS: ROTATOR CUFF: The supraspinatus, infraspinatus, subscapularis and teres minor 
tendons are intact. Mild infraspinatus fatty muscular atrophy. 
ACROMIOCLAVICULAR JOINT: Mild degenerative change of the acromioclavicular joint 
without mass effect upon the supraspinatus. The coracoacromial ligament is 
intact without prominent spurring at the acromial attachment. The 
acromioclavicular and coracoclavicular ligaments are preserved. The acromium is 
normal in morphology. 
GLENOHUMERAL JOINT: 0.4 cm posterior humeral head subluxation. Moderate 
glenohumeral joint degenerative change with partial-thickness and full-thickness 
chondromalacia. Small osteophytes. Small posterior inferior glenoid cysts 
measure up to 1.1 cm. Degenerative posterior labral tears and 0.4 cm  posterior 
paralabral cyst and adjacent soft tissue swelling. The intra-articular portion 
of the long head of the biceps tendon is negative. No shoulder joint effusion. 
BONES: The bone marrow signal intensity is negative for fracture. No Hill-Sachs 
defect. Subcortical cystic change of the humeral head. 
ADDITIONAL FINDINGS: The axillary region is negative. Subcutaneous tissues are 
negative.
IMPRESSION: 1.  No rotator cuff tear. 
2.  Mild infraspinatus fatty muscular atrophy. 
3.  Moderate glenohumeral joint degenerative change, mild posterior humeral head 
subluxation, degenerative posterior labral tears, 0.4 cm posterior paralabral 
cyst and adjacent soft tissue swelling.  
4.  Mild AC joint degenerative change without mass effect upon the 
supraspinatus.

## 2022-05-11 IMAGING — MR MRI LUMBAR SPINE WITHOUT CONTRAST
6 of 9 series · 16 of 48 positions shown · IV contrast (gadolinium)
Comparison: None

________________________________________________________________________________________________ 
MRI LUMBAR SPINE WITHOUT CONTRAST, 05/11/2022 [DATE]: 
CLINICAL INDICATION: Lumbar radiculopathy, low back pain
TECHNIQUE: Sagittal T1, Sagittal T2, Sagittal STIR, Axial T1 and Axial T2 MR 
images of the lumbar spine were performed without intravenous gadolinium 
enhancement.

[Series 101: survey · axial · 10.0mm · 1.39mm/px · 1 of 9 slices shown]
[im 1/9]
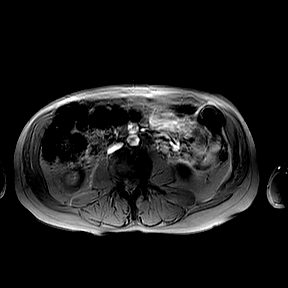

[Series 201: t2w_cor-surv · coronal · 6.0mm · 0.50mm/px · 1 of 5 slices shown]
[im 1/5]
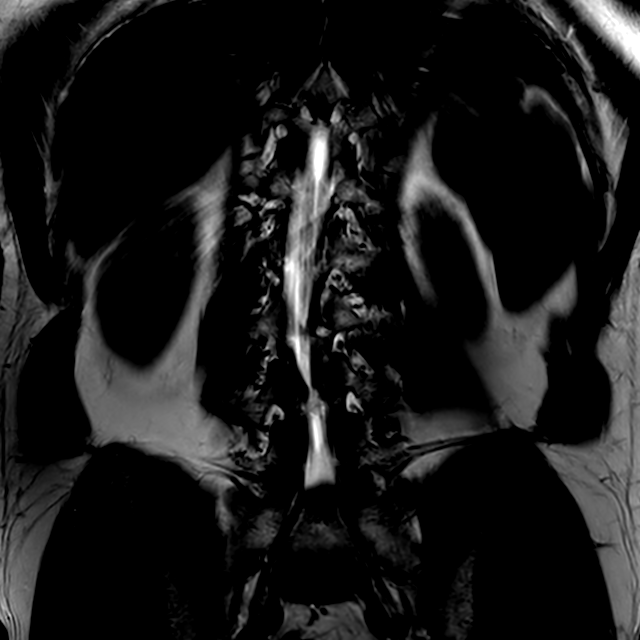

[Series 301: t1w_tse sag · sagittal · 4.0mm · 0.26mm/px · 2 of 17 slices shown]
[im 1/17]
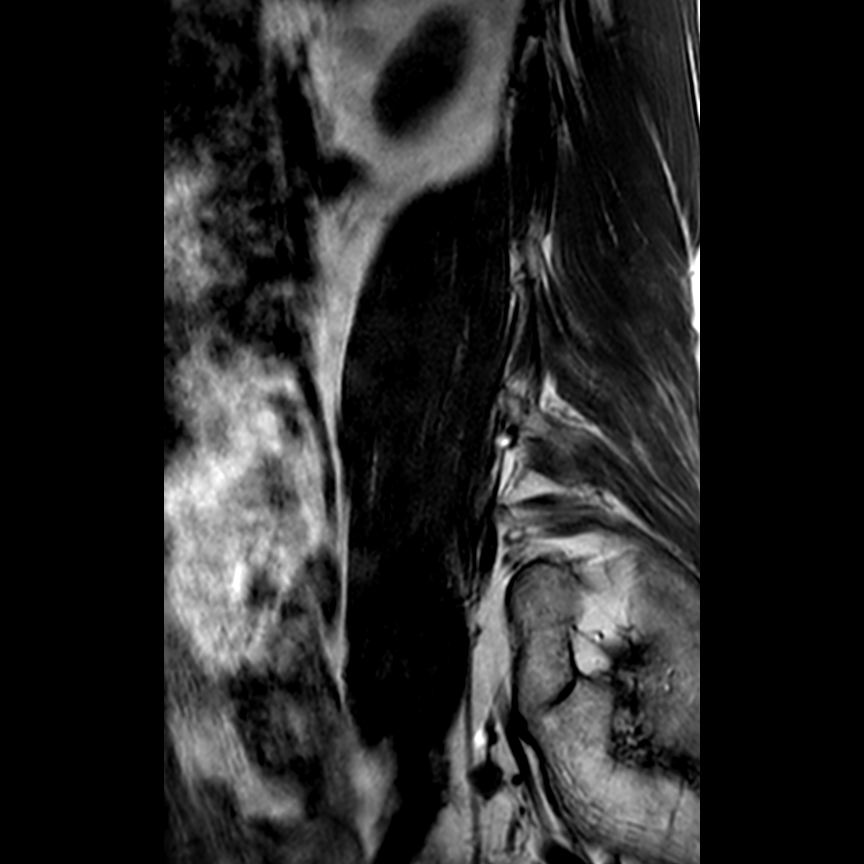
[im 17/17]
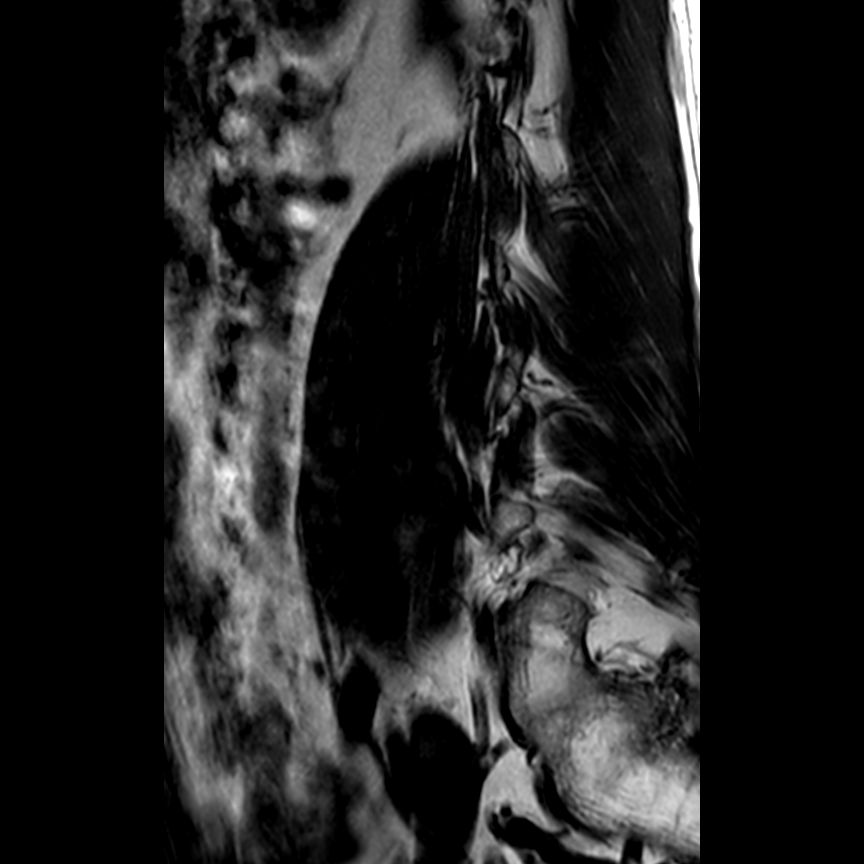

[Series 402: sst2 sag/fs · sagittal · 4.0mm · 0.47mm/px · 2 of 17 slices shown]
[im 1/17]
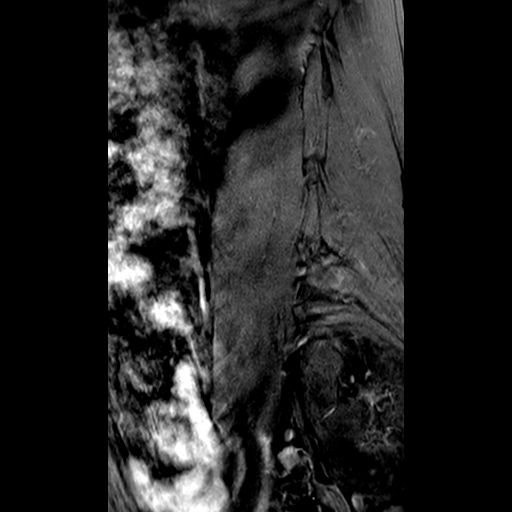
[im 17/17]
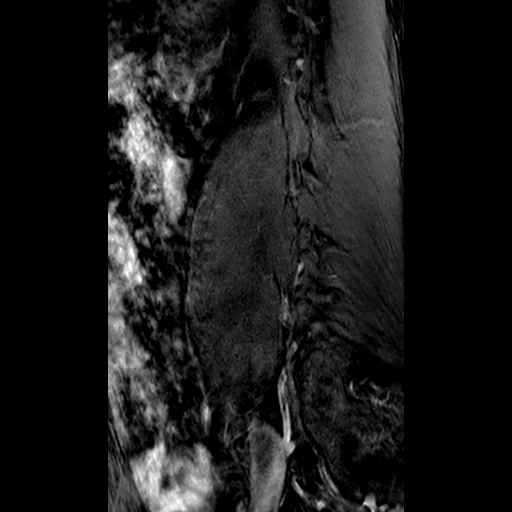

[Series 403: st2 sag · sagittal · 4.0mm · 0.47mm/px · 2 of 17 slices shown]
[im 1/17]
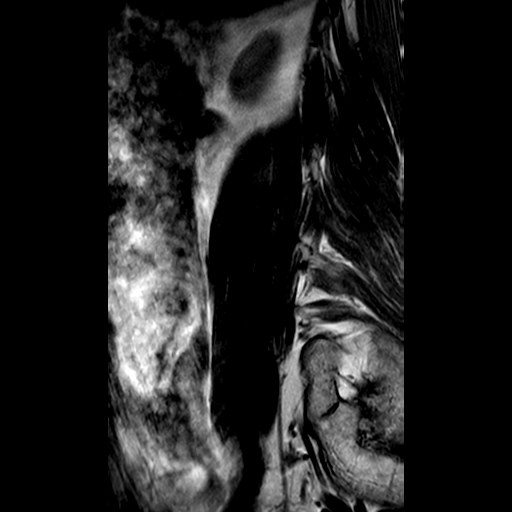
[im 17/17]
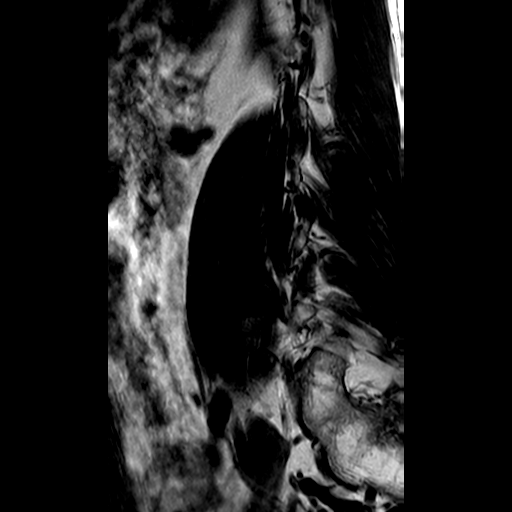

[Series 501: 3d_spine_view_t2w sag · sagittal · 1.5mm · 0.47mm/px · 8 of 107 slices shown]
[im 1/107]
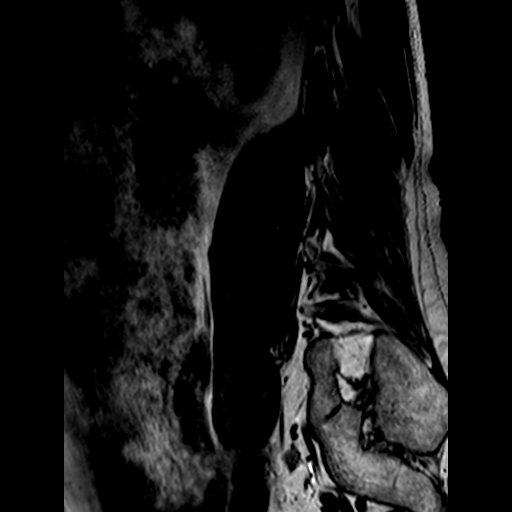
[im 17/107]
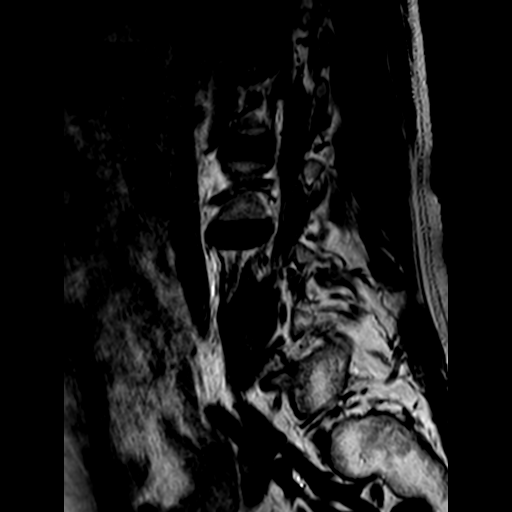
[im 33/107]
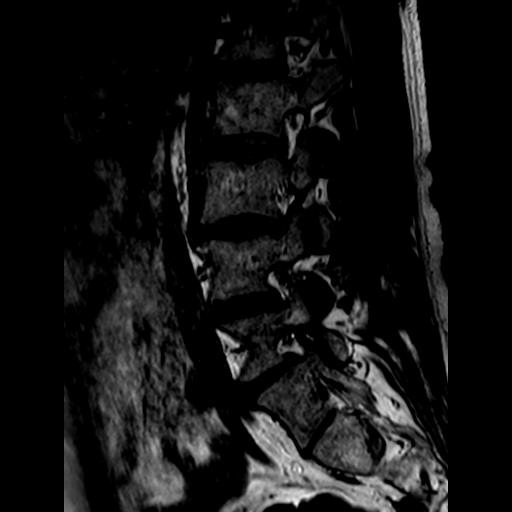
[im 49/107]
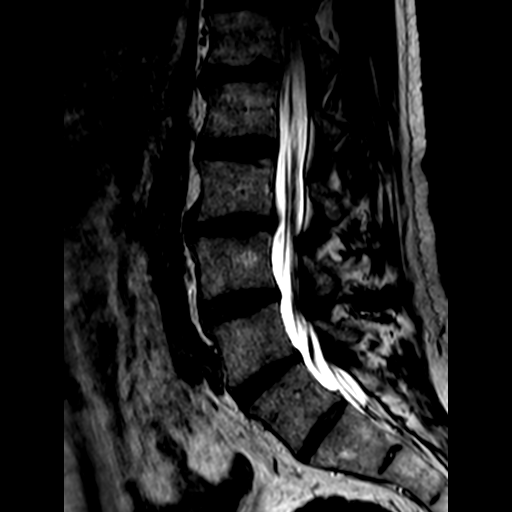
[im 58/107]
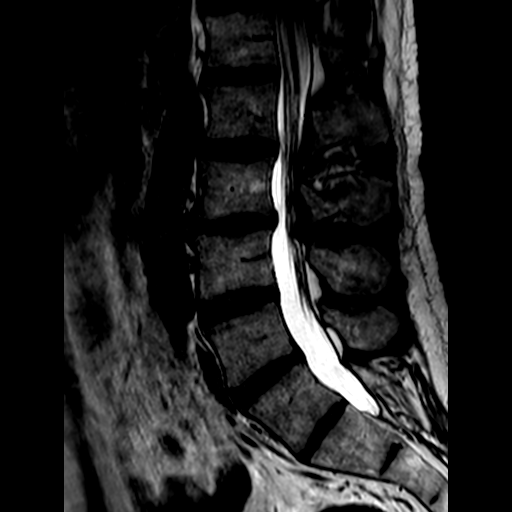
[im 74/107]
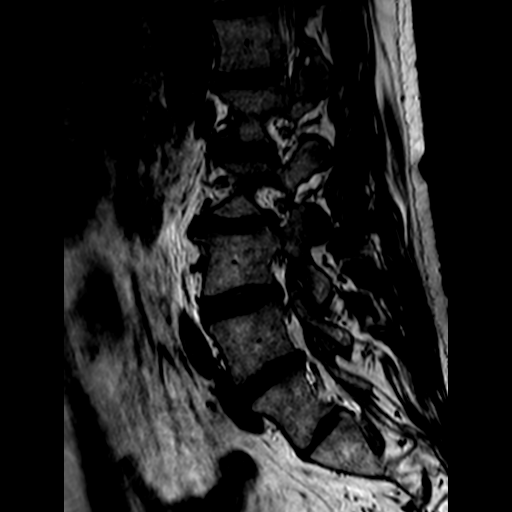
[im 90/107]
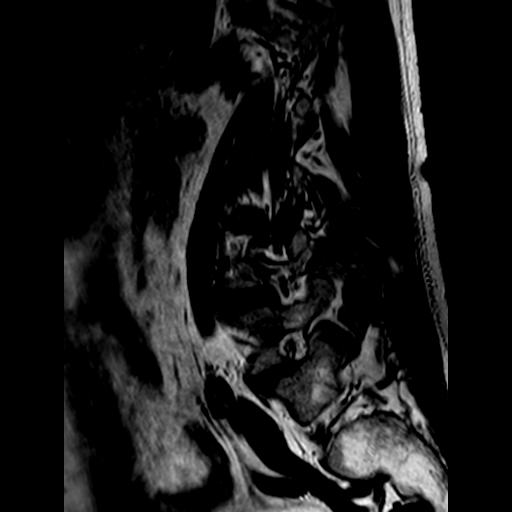
[im 107/107]
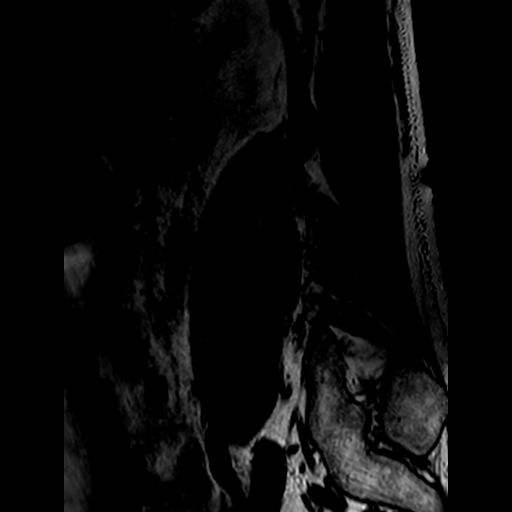

[16 of 48 positions shown; findings below may reference images not displayed]

FINDINGS: Lumbar vertebral heights are intact. 2 mm retrolisthesis at L2-3. Mild 
Modic type I change at L4-5. No evidence for malignancy. Conus terminates 
opposite T12. 
The L5-S1 disc space is rudimentary. L5 is transitional. The canal and foramina 
are open at this level. 
At L4-5 there is mild disc bulge. The canal is open. Foramina are open. 
At L3-4 the canal and foramina are open. 
At L2-3 there is a right posterolateral disc extrusion measuring 9 x 5 mm, 
deforming the thecal sac and impinging the right L3 nerve root. Foramina remain 
open. 
At L1-2 the canal and foramina are open.
IMPRESSION: Caution as to labeling. The L5-S1 disc space is rudimentary. 
At L2-3 there is a right posterolateral disc extrusion impinging the right L3 
nerve root.

## 2023-06-18 IMAGING — MR MRI LEFT KNEE WITHOUT CONTRAST
4 of 5 series · 24 of 40 positions shown · IV contrast (gadolinium)
Comparison: None.

________________________________________________________________________________________________ 
MRI LEFT KNEE WITHOUT CONTRAST, 06/18/2023 [DATE]: 
CLINICAL INDICATION: Left knee pain.
TECHNIQUE: Multiplanar, multiecho position MR images of the left knee were 
performed without intravenous gadolinium enhancement.

[Series 301: PD fat-sat · axial · left · 3.0mm · 0.36mm/px · z∈[-90,+32]mm · 8 of 36 slices shown (1 of 3)]
[im 1/36]
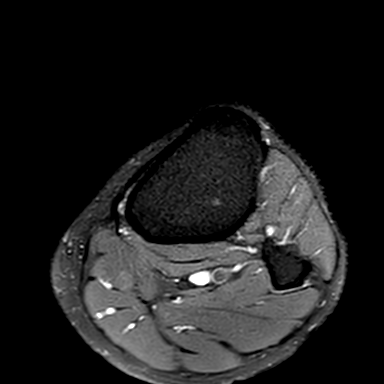
[im 4/36]
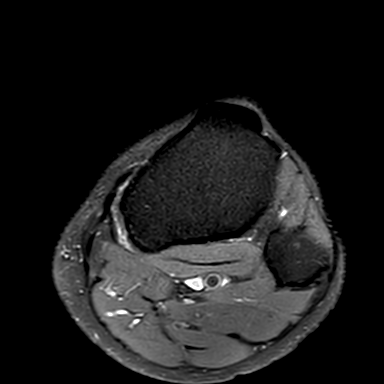
[im 12/36]
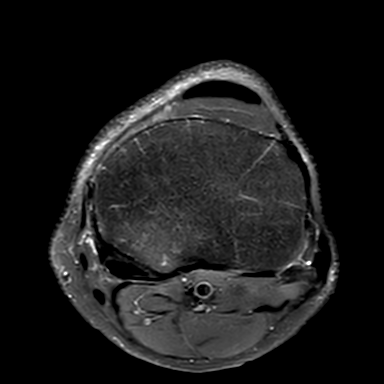
[im 16/36]
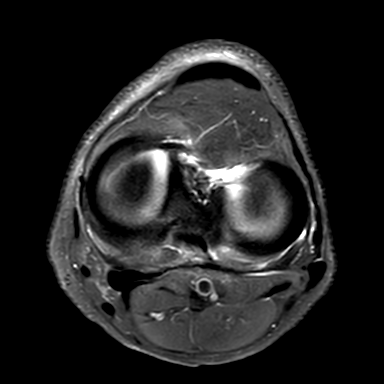
[im 20/36]
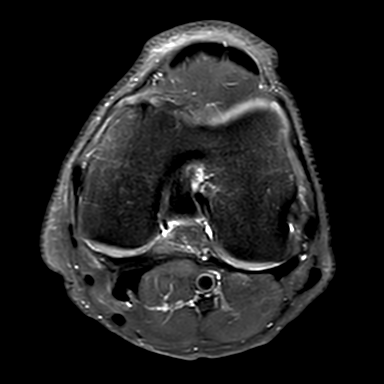
[im 24/36]
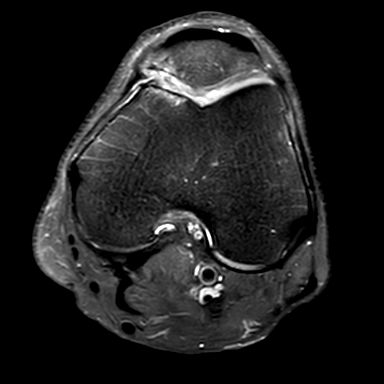
[im 32/36]
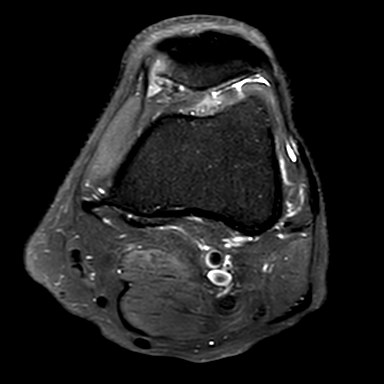
[im 36/36]
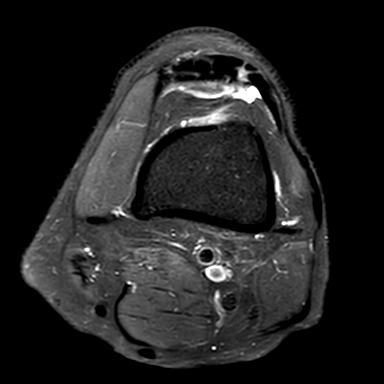

[Series 401: PD fat-sat · sagittal · left · 3.0mm · 0.29mm/px · 9 of 32 slices shown (2 of 3)]
[im 1/32]
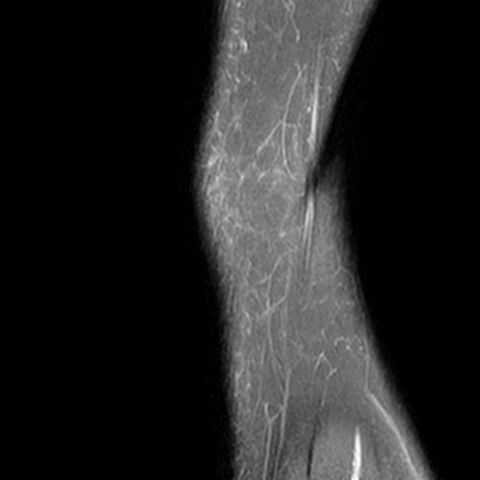
[im 4/32]
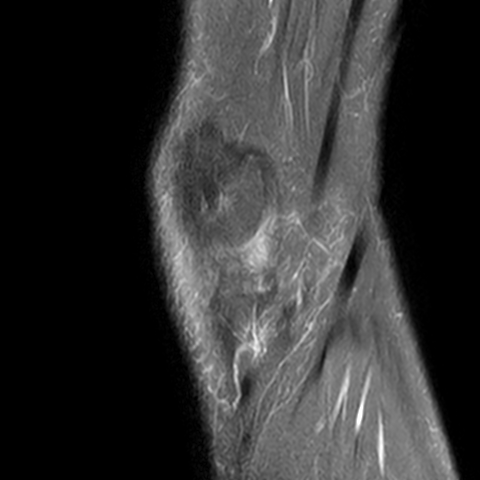
[im 8/32]
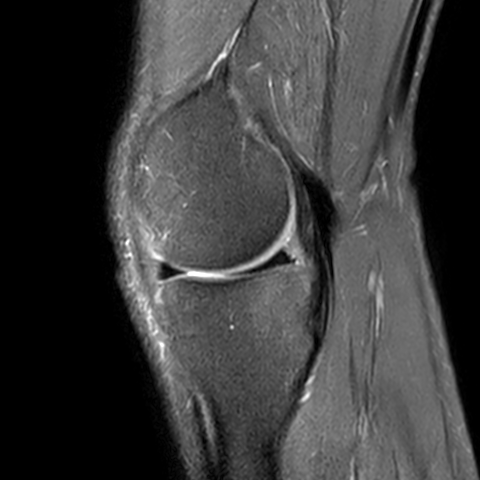
[im 12/32]
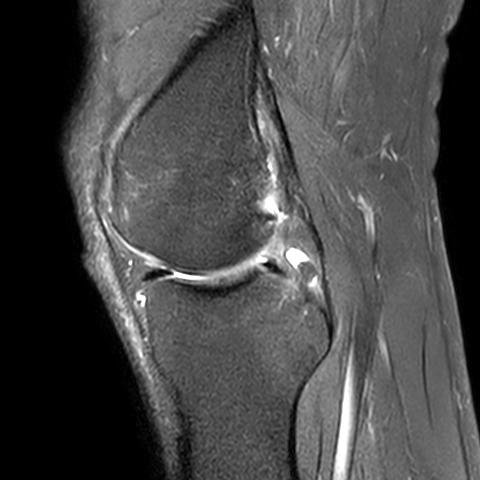
[im 16/32]
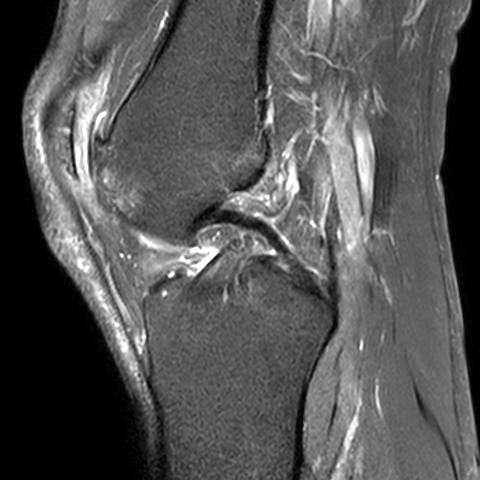
[im 20/32]
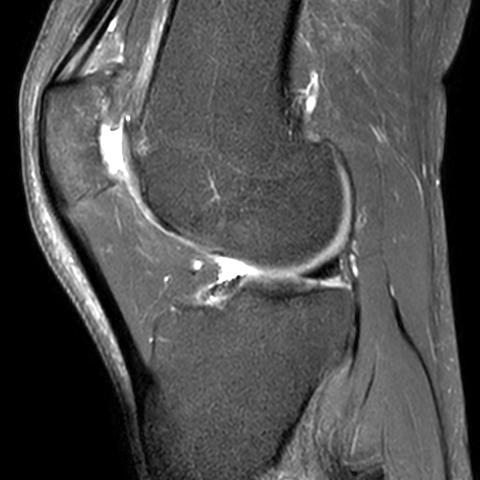
[im 24/32]
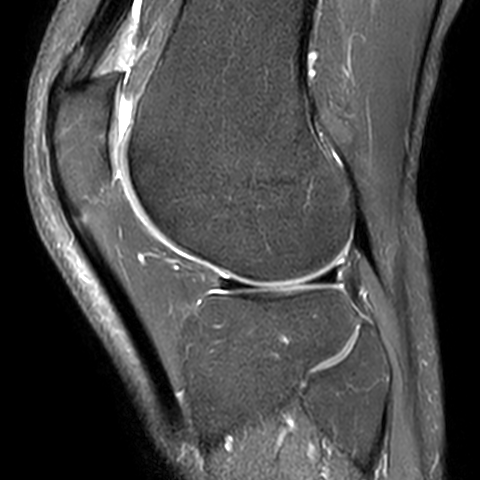
[im 28/32]
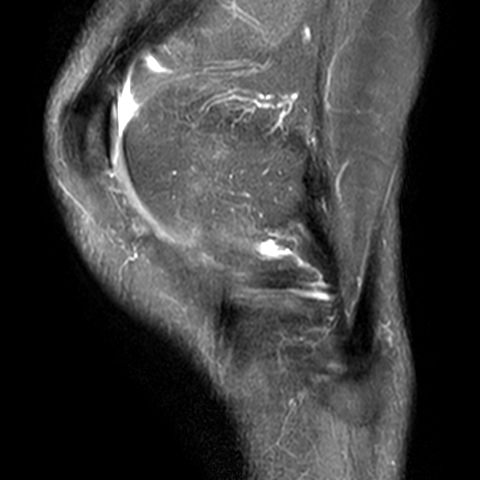
[im 32/32]
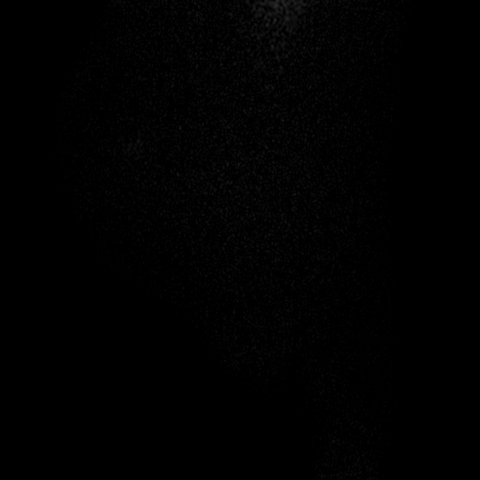

[Series 501: PD fat-sat · coronal · left · 3.0mm · 0.31mm/px · 4 of 35 slices shown (3 of 3)]
[im 1/35]
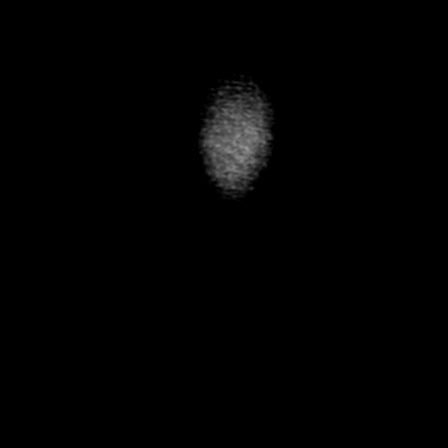
[im 4/35]
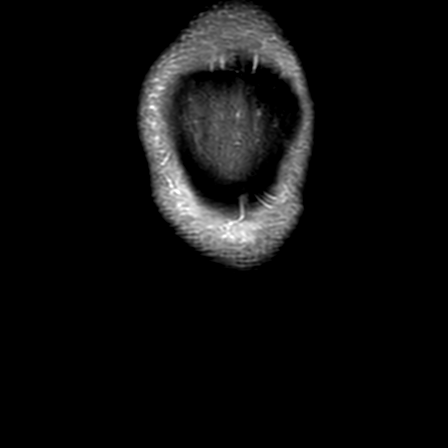
[im 19/35]
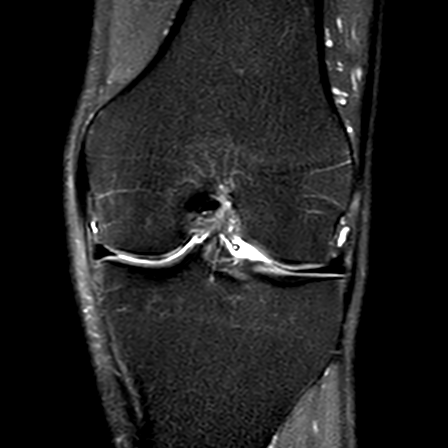
[im 31/35]
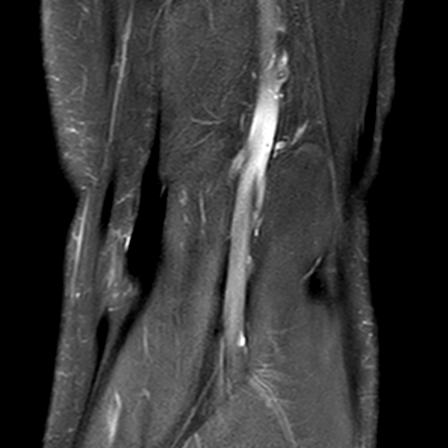

[Series 601: T1 · coronal · left · 3.0mm · 0.27mm/px · 3 of 35 slices shown]
[im 4/35]
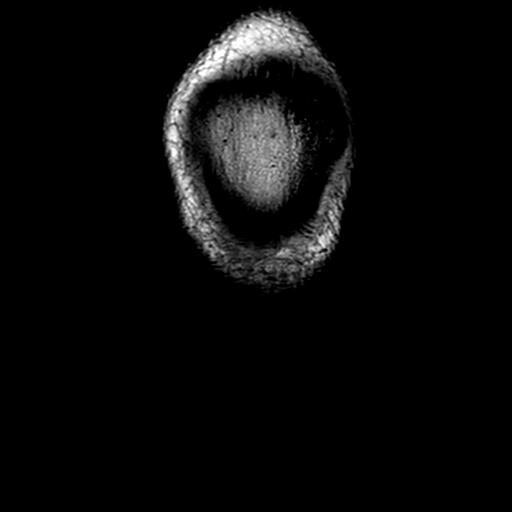
[im 19/35]
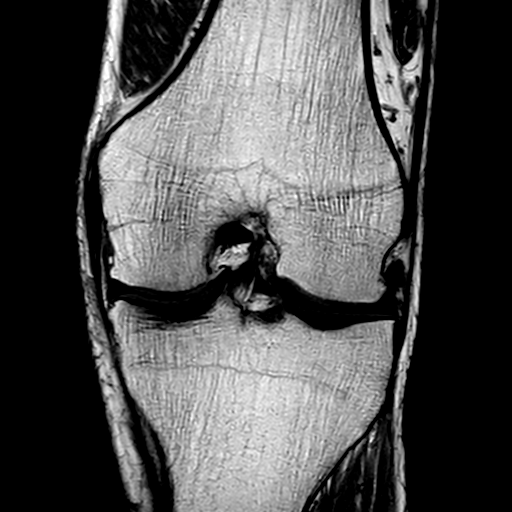
[im 31/35]
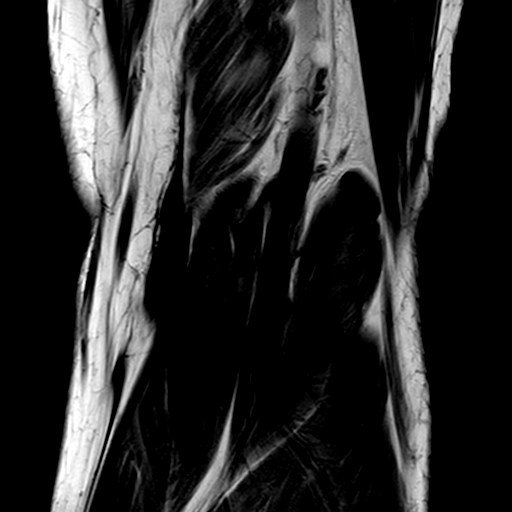

[24 of 40 positions shown; findings below may reference images not displayed]

FINDINGS: MEDIAL COMPARTMENT: The medial meniscus is intact without tear or extrusion. Up 
to grade II chondromalacia. 
LATERAL COMPARTMENT: The lateral meniscus is intact without tear or extrusion. 
Articular cartilage is preserved. 
PATELLOFEMORAL COMPARTMENT: The patella is centrally located. Up to grade IV 
chondromalacia and subcortical cystic change of the medial patellofemoral joint 
and patellar apex.  
PROXIMAL TIBIOFIBULAR JOINT: Preserved. 
LIGAMENTS: The anterior cruciate ligament is intact. The posterior cruciate 
ligament is intact. The medial collateral ligament and lateral collateral 
ligament are preserved.  
EXTENSOR MECHANISM: The quadriceps and patellar tendon are preserved. The medial 
and lateral retinacula are intact. 
POSTEROMEDIAL CORNER: The semimembranosus and pes anserine tendons are 
preserved. The posterior oblique ligament and posterior medial joint capsule are 
intact. 
POSTEROLATERAL CORNER: The popliteal tendon and popliteofibular ligament are 
intact. The biceps femoris is negative. 
BONES AND SOFT TISSUES: Bone marrow signal intensity is negative for fracture or 
contusion. Small joint effusion. Mildly thickened (0.2 cm) medial patellar 
plica. No popliteal cyst. The musculature is symmetric without mass, signal 
abnormality or atrophy. Neurovascular bundles are negative. Subcutaneous tissues 
are negative.
IMPRESSION: 1.  Marked patellofemoral and mild medial compartment degenerative change. 
2.  Small joint effusion and mildly thickened medial patellar plica. 
3.  Otherwise, unremarkable.
